# Patient Record
Sex: Female | Born: 1935 | Race: White | Hispanic: No | Marital: Married | Smoking: Former smoker
Health system: Southern US, Community
[De-identification: ages and names within clinical notes are randomized; demographics above are authoritative.]

## PROBLEM LIST (undated history)

## (undated) DIAGNOSIS — I10 Essential (primary) hypertension: Secondary | ICD-10-CM

## (undated) DIAGNOSIS — R413 Other amnesia: Secondary | ICD-10-CM

## (undated) DIAGNOSIS — F419 Anxiety disorder, unspecified: Secondary | ICD-10-CM

## (undated) DIAGNOSIS — E079 Disorder of thyroid, unspecified: Secondary | ICD-10-CM

## (undated) HISTORY — PX: ABDOMINAL HYSTERECTOMY: SHX81

## (undated) HISTORY — DX: Other amnesia: R41.3

## (undated) HISTORY — DX: Anxiety disorder, unspecified: F41.9

## (undated) HISTORY — PX: HIP FUSION: SHX669

## (undated) HISTORY — DX: Essential (primary) hypertension: I10

## (undated) HISTORY — DX: Disorder of thyroid, unspecified: E07.9

## (undated) HISTORY — PX: TUBAL LIGATION: SHX77

---

## 1997-10-20 ENCOUNTER — Observation Stay (HOSPITAL_COMMUNITY): Admission: RE | Admit: 1997-10-20 | Discharge: 1997-10-21 | Payer: Self-pay | Admitting: Urology

## 2001-05-13 ENCOUNTER — Encounter: Payer: Self-pay | Admitting: Emergency Medicine

## 2001-05-13 ENCOUNTER — Emergency Department (HOSPITAL_COMMUNITY): Admission: EM | Admit: 2001-05-13 | Discharge: 2001-05-14 | Payer: Self-pay | Admitting: Emergency Medicine

## 2001-10-19 ENCOUNTER — Ambulatory Visit (HOSPITAL_COMMUNITY): Admission: RE | Admit: 2001-10-19 | Discharge: 2001-10-19 | Payer: Self-pay | Admitting: Gastroenterology

## 2001-11-21 ENCOUNTER — Encounter: Payer: Self-pay | Admitting: Gastroenterology

## 2001-11-21 ENCOUNTER — Encounter: Admission: RE | Admit: 2001-11-21 | Discharge: 2001-11-21 | Payer: Self-pay | Admitting: Gastroenterology

## 2002-07-23 ENCOUNTER — Encounter: Admission: RE | Admit: 2002-07-23 | Discharge: 2002-07-23 | Payer: Self-pay | Admitting: Family Medicine

## 2002-07-23 ENCOUNTER — Encounter: Payer: Self-pay | Admitting: Family Medicine

## 2002-10-21 ENCOUNTER — Encounter: Admission: RE | Admit: 2002-10-21 | Discharge: 2002-10-21 | Payer: Self-pay | Admitting: Gastroenterology

## 2002-10-21 ENCOUNTER — Encounter: Payer: Self-pay | Admitting: Gastroenterology

## 2002-11-13 ENCOUNTER — Encounter: Payer: Self-pay | Admitting: Gastroenterology

## 2002-11-13 ENCOUNTER — Encounter: Admission: RE | Admit: 2002-11-13 | Discharge: 2002-11-13 | Payer: Self-pay | Admitting: Gastroenterology

## 2002-11-18 ENCOUNTER — Emergency Department (HOSPITAL_COMMUNITY): Admission: EM | Admit: 2002-11-18 | Discharge: 2002-11-18 | Payer: Self-pay | Admitting: Emergency Medicine

## 2002-11-18 ENCOUNTER — Encounter: Payer: Self-pay | Admitting: Emergency Medicine

## 2002-12-04 ENCOUNTER — Encounter (INDEPENDENT_AMBULATORY_CARE_PROVIDER_SITE_OTHER): Payer: Self-pay | Admitting: Specialist

## 2002-12-04 ENCOUNTER — Ambulatory Visit (HOSPITAL_COMMUNITY): Admission: RE | Admit: 2002-12-04 | Discharge: 2002-12-04 | Payer: Self-pay | Admitting: Gastroenterology

## 2003-11-10 ENCOUNTER — Observation Stay (HOSPITAL_COMMUNITY): Admission: RE | Admit: 2003-11-10 | Discharge: 2003-11-11 | Payer: Self-pay | Admitting: Urology

## 2005-03-27 ENCOUNTER — Emergency Department (HOSPITAL_COMMUNITY): Admission: EM | Admit: 2005-03-27 | Discharge: 2005-03-27 | Payer: Self-pay | Admitting: Emergency Medicine

## 2005-12-09 ENCOUNTER — Encounter (INDEPENDENT_AMBULATORY_CARE_PROVIDER_SITE_OTHER): Payer: Self-pay | Admitting: Specialist

## 2005-12-09 ENCOUNTER — Ambulatory Visit (HOSPITAL_COMMUNITY): Admission: RE | Admit: 2005-12-09 | Discharge: 2005-12-09 | Payer: Self-pay | Admitting: Gastroenterology

## 2005-12-13 ENCOUNTER — Ambulatory Visit (HOSPITAL_COMMUNITY): Admission: RE | Admit: 2005-12-13 | Discharge: 2005-12-13 | Payer: Self-pay | Admitting: Gastroenterology

## 2005-12-20 ENCOUNTER — Encounter: Admission: RE | Admit: 2005-12-20 | Discharge: 2005-12-20 | Payer: Self-pay | Admitting: Gastroenterology

## 2006-10-01 ENCOUNTER — Emergency Department (HOSPITAL_COMMUNITY): Admission: EM | Admit: 2006-10-01 | Discharge: 2006-10-01 | Payer: Self-pay | Admitting: Emergency Medicine

## 2008-05-25 ENCOUNTER — Emergency Department (HOSPITAL_COMMUNITY): Admission: EM | Admit: 2008-05-25 | Discharge: 2008-05-25 | Payer: Self-pay | Admitting: Emergency Medicine

## 2008-05-27 ENCOUNTER — Ambulatory Visit (HOSPITAL_COMMUNITY): Admission: RE | Admit: 2008-05-27 | Discharge: 2008-05-27 | Payer: Self-pay | Admitting: Emergency Medicine

## 2009-05-07 ENCOUNTER — Encounter: Admission: RE | Admit: 2009-05-07 | Discharge: 2009-05-07 | Payer: Self-pay | Admitting: Neurology

## 2009-06-09 ENCOUNTER — Ambulatory Visit (HOSPITAL_COMMUNITY): Admission: RE | Admit: 2009-06-09 | Discharge: 2009-06-09 | Payer: Self-pay | Admitting: Neurosurgery

## 2009-06-16 ENCOUNTER — Encounter: Admission: RE | Admit: 2009-06-16 | Discharge: 2009-06-16 | Payer: Self-pay | Admitting: Neurosurgery

## 2009-08-03 ENCOUNTER — Inpatient Hospital Stay (HOSPITAL_COMMUNITY): Admission: RE | Admit: 2009-08-03 | Discharge: 2009-08-07 | Payer: Self-pay | Admitting: Orthopedic Surgery

## 2010-06-22 LAB — URINALYSIS, ROUTINE W REFLEX MICROSCOPIC
Ketones, ur: NEGATIVE mg/dL
Nitrite: NEGATIVE
Nitrite: NEGATIVE
Specific Gravity, Urine: 1.01 (ref 1.005–1.030)
Urobilinogen, UA: 0.2 mg/dL (ref 0.0–1.0)
pH: 6 (ref 5.0–8.0)
pH: 5.5 (ref 5.0–8.0)

## 2010-06-22 LAB — CBC
Hemoglobin: 11.4 g/dL — ABNORMAL LOW (ref 12.0–15.0)
Hemoglobin: 9 g/dL — ABNORMAL LOW (ref 12.0–15.0)
Hemoglobin: 9.8 g/dL — ABNORMAL LOW (ref 12.0–15.0)
Hemoglobin: 11.7 g/dL — ABNORMAL LOW (ref 12.0–15.0)
MCHC: 33.6 g/dL (ref 30.0–36.0)
Platelets: 262 10*3/uL (ref 150–400)
RBC: 3.53 MIL/uL — ABNORMAL LOW (ref 3.87–5.11)
RDW: 13.8 % (ref 11.5–15.5)
RDW: 19.1 % — ABNORMAL HIGH (ref 11.5–15.5)
RDW: 13.9 % (ref 11.5–15.5)
WBC: 7.8 10*3/uL (ref 4.0–10.5)
WBC: 8.3 10*3/uL (ref 4.0–10.5)

## 2010-06-22 LAB — BASIC METABOLIC PANEL
BUN: 11 mg/dL (ref 6–23)
CO2: 30 mEq/L (ref 19–32)
Calcium: 8.5 mg/dL (ref 8.4–10.5)
Calcium: 8.8 mg/dL (ref 8.4–10.5)
Calcium: 8.8 mg/dL (ref 8.4–10.5)
GFR calc Af Amer: 53 mL/min — ABNORMAL LOW (ref >60)
GFR calc Af Amer: >60 mL/min (ref >60)
GFR calc non Af Amer: 44 mL/min — ABNORMAL LOW (ref >60)
GFR calc non Af Amer: 59 mL/min — ABNORMAL LOW (ref >60)
Glucose, Bld: 120 mg/dL — ABNORMAL HIGH (ref 70–99)
Glucose, Bld: 140 mg/dL — ABNORMAL HIGH (ref 70–99)
Glucose, Bld: 184 mg/dL — ABNORMAL HIGH (ref 70–99)
Potassium: 3.8 mEq/L (ref 3.5–5.1)
Sodium: 134 mEq/L — ABNORMAL LOW (ref 135–145)
Sodium: 139 mEq/L (ref 135–145)

## 2010-06-22 LAB — URINE CULTURE: Culture: NO GROWTH

## 2010-06-22 LAB — TYPE AND SCREEN: Antibody Screen: NEGATIVE

## 2010-06-22 LAB — COMPREHENSIVE METABOLIC PANEL
ALT: 13 U/L (ref 0–35)
Albumin: 4.3 g/dL (ref 3.5–5.2)
Alkaline Phosphatase: 47 U/L (ref 39–117)
Potassium: 4.4 mEq/L (ref 3.5–5.1)
Sodium: 141 mEq/L (ref 135–145)
Total Protein: 7.2 g/dL (ref 6.0–8.3)

## 2010-06-22 LAB — PROTIME-INR
INR: 1 (ref 0.00–1.49)
INR: 1.12 (ref 0.00–1.49)
INR: 1.2 (ref 0.00–1.49)
Prothrombin Time: 14.3 seconds (ref 11.6–15.2)
Prothrombin Time: 14.6 seconds (ref 11.6–15.2)

## 2010-06-22 LAB — ABO/RH: ABO/RH(D): A POS

## 2010-08-20 NOTE — Op Note (Signed)
NAME:  Elaine Mcdonald, Elaine Mcdonald                      ACCOUNT NO.:  [id number]   MEDICAL RECORD NO.:  [id number]                   PATIENT TYPE:  AMB   LOCATION:  ENDO                                 FACILITY:  [facility]   PHYSICIAN:  Anselmo Rod, M.D.               DATE OF BIRTH:  1930-12-01   DATE OF PROCEDURE:  12/05/2002  DATE OF DISCHARGE:  12/04/2002                                 OPERATIVE REPORT   PROCEDURE. PERFORMED:  Colonoscopy with biopsies.   ENDOSCOPIST:  Charna Elizabeth, M.D.   INSTRUMENT USED:  Olympus video colonoscope.   INDICATIONS FOR PROCEDURE:  The patient is a 77 year old white female with a  history of change in bowel habits, severe diarrhea and abdominal pain.  Rule  out colonic polyps, masses, etc.  The patient is known to have  diverticulosis from a limited exam done by Dr. Tasia Catchings in the recent  past.  History of intermittent rectal bleeding with change in bowel habits.   PREPROCEDURE PREPARATION:  Informed consent was procured from the patient.  The patient was advised to discontinue her aspirin and Plavix prior to the  procedure and was prepped with a bottle of magnesium citrate and a gallon of  GoLYTELY the night prior to the procedure.   PREPROCEDURE PHYSICAL:  The patient had stable vital signs.  Neck supple.  Chest clear to auscultation.  S1 and S2 regular.  Abdomen soft with normal  bowel sounds.   DESCRIPTION OF PROCEDURE:  The patient was placed in left lateral decubitus  position and sedated with an additional 20 mg of Demerol and 2 mg of Versed  intravenously.  Once the patient was adequately sedated and maintained on  low flow oxygen and continuous cardiac monitoring, the Olympus video  colonoscope was advanced from the rectum to the cecum with difficulty.  The  patient had a very tortuous atonic colon.  The patient's position was  changed from the left lateral to the supine and right lateral position.  With gentle application of  abdominal pressure to reach the cecum, there was  a large amount of residual stool in the cecum and therefore the cecal base  was not identified but the ileocecal valve appeared healthy.  There were a  few scattered diverticula in the left colon.  There was a patchy area of  erythema biopsied from 10 to 20 cm to rule out colitis.  Internal  hemorrhoids were seen on retroflexion in the rectum which explained the  patient's rectal bleeding.  No masses or polyps were present.  The patient  tolerated the procedure well without complications.   IMPRESSION:  1. Very tortuous atonic colon (technically difficult procedure).  2. Left-sided diverticulosis.  3. Patchy erythema from 10 to 20 cm biopsied for pathology to rule out     colitis.  4. Internal hemorrhoids.   RECOMMENDATIONS:  1. Await pathology results.  2. High fiber diet with liberal  fluid intake.  3. Outpatient follow-up in the next two weeks for further recommendations.                                                   Anselmo Rod, M.D.    JNM/MEDQ  D:  12/05/2002  T:  12/06/2002  Job:  [id number]   cc:   Teena Irani. Arlyce Dice, M.D.  [address]  [place]  [place] [address]  Fax: [phone]

## 2010-08-20 NOTE — Op Note (Signed)
NAME:  Elaine Mcdonald, Elaine Mcdonald                      ACCOUNT NO.:  [id number]   MEDICAL RECORD NO.:  [id number]                   PATIENT TYPE:  OBV   LOCATION:  0381                                 FACILITY:  [facility]   PHYSICIAN:  Valetta Fuller, M.D.               DATE OF BIRTH:  06-Sep-1930   DATE OF PROCEDURE:  11/10/2003  DATE OF DISCHARGE:                                 OPERATIVE REPORT   PREOPERATIVE DIAGNOSES:  Stress urinary incontinence.   POSTOPERATIVE DIAGNOSES:  Stress urinary incontinence.   PROCEDURE:  Transobturator urethral sling.   SURGEON:  Valetta Fuller, M.D.   RESIDENT SURGEON:  Thyra Breed, MD   ANESTHESIA:  General endotracheal.   ESTIMATED BLOOD LOSS:  Less than 15 mL.   DRAINS:  5 French Foley catheter to straight drain.   COMPLICATIONS:  None.   INDICATIONS FOR PROCEDURE:  Ms. Hollingshead is a pleasant 77 year old female  with a complicated urologic history.  She has had problems in the past with  both stress and urge urinary incontinence especially at night. She had a  bladder tack and hysterectomy performed in the early 1980's.  Approximately  4-5 years ago, she also had a sling procedure performed with bone anchors by  Dr. Patsi Sears. She initially had marked improvement but has now developed  recurrent leakage most consistent with a stress component.  On physical  examination, she showed evidence of vaginal atrophy as well as moderate to  severe stress leakage.  At this time, she is willing to undergo with third  procedure to try and remedy her stress urinary incontinence.  We have  discussed with her the possibilities including a SPARC versus a  transobturator sling which can be decided in the operating room once her  anatomy is better defined. The patient understands all the risks, benefits,  and alternatives of this procedure and is willing to proceed. Informed  consent has been obtained.   DESCRIPTION OF PROCEDURE:  Following identification by  her arm bracelet, the  patient was brought to the operating room and placed in the supine position.  Her she underwent successful induction of general endotracheal anesthesia  and received preoperative IV antibiotics. She was then moved to the dorsal  lithotomy position.  Her perineum and genitalia were then prepped with  Betadine and she was draped in the usual sterile fashion.  We placed a  weighted vaginal speculum within the vaginal vault. There did appear to be  vaginal atrophy present consistent with a hypoestrogenic state.  A 16 French  Foley catheter was then placed to straight drain and the bladder drained in  its entirety. A catheter plug was then used to cap the Foley catheter.  An  Allis clamp was then used to grasp the distal aspect of the urethra and used  to elevate the anterior vaginal mucosa.  There was definitely evidence of  previous procedures in this area as there was  some fibrotic tissue as well  as scarring in the anterior vaginal vault.  A grade 1-2 cystocele was also  noted but was largely posterior to the area of the proposed sling procedure.  Therefore we elected not to perform any modification to relieve her  cystocele.  Marcaine 0.25% with epinephrine was then used to infiltrate the  anterior vaginal mucosa in preparation for the incision.  A #11 blade  scalpel on a long handle knife was then used to create an approximate 1 cm  vertical incision at mid urethra on the anterior vaginal mucosa.  Using  [employer] and pickups with teeth, we then carefully dissected  laterally with care taken to avoid the urethra to create an area for passage  of the transobturator needle. This tissue was quite fibrotic and required  more dissection than usual. However, with continued careful dissection, both  sides were developed lateral to the urethra just beneath the pubis to  facilitate the surgeon's finger.  At this time, we identified the area for  our transobturator  incisions.  Approximately 5 cm lateral to the clitoris  bilaterally we marked our areas to make our incisions. Using a 15 blade  scalpel, two stab incisions were then created. The C shaped transobturator  needle was then utilized first on the patient's left to puncture the  obturator membrane and then pass close to the pubic bone and finally guided  by the surgeon's finger through the previously created anterior vaginal  incision lateral and well away from the urethra. Care was taken not to  button hole the vaginal mucosa. This was visually inspected to ensure proper  passage of the needle into the vagina.  We then placed one end of the  transobturator tape through the needle. This was pulled back through the  stab incision previously created.  An identical procedure was then performed  on the patient's right.  The tape was then pulled until there was adequate  tension beneath the mid urethra.  A right angled clamp was then able to pass  between the urethra and the sling tape to ensure proper tensioning.  In  fact, we applied slightly more tension to the tape given this as her third  incontinence procedure with abundant fibrotic tissue underlying the urethra.  Once satisfied with the placement of our tape, excess tape was then trimmed  exiting the stab incisions.  A one layer closure was then performed in the  anterior vaginal mucosa using a running 2-0 Vicryl suture.  Hemostasis was  excellent at this point and there was no evidence of active bleeding.  Vaginal packing soaked in estrogen cream was then inserted into the vagina.  The catheter was hooked to straight drainage. The stab incisions were  cleaned, dried and Dermabond applied.  This marked termination of the  procedure.  The patient tolerated the procedure well and there were no  complications.  Please note that Valetta Fuller, M.D. was present and participated in the entire procedure as he was the responsible surgeon.  All   sponge, needle and instrument counts were correct x2.   DISPOSITION:  After awaking from general anesthesia, the patient was  transported to the post anesthesia care unit in stable condition.  From  here, she would be admitted for 23 hour observation and removal of her Foley  catheter and vaginal packing in the morning.     Thyra Breed, MD  Valetta Fuller, M.D.    EG/MEDQ  D:  11/10/2003  T:  11/11/2003  Job:  [id number]

## 2010-08-20 NOTE — Op Note (Signed)
NAME:  Elaine Mcdonald, Elaine Mcdonald                      ACCOUNT NO.:  [id number]   MEDICAL RECORD NO.:  [id number]                   PATIENT TYPE:  AMB   LOCATION:  ENDO                                 FACILITY:  [facility]   PHYSICIAN:  Anselmo Rod, M.D.               DATE OF BIRTH:  October 05, 1930   DATE OF PROCEDURE:  12/04/2002  DATE OF DISCHARGE:  12/04/2002                                 OPERATIVE REPORT   PROCEDURE PERFORMED:  Esophagogastroduodenoscopy with multiple biopsies.   ENDOSCOPIST:  Charna Elizabeth, M.D.   INSTRUMENT USED:  Olympus video panendoscope.   INDICATIONS FOR PROCEDURE:  The patient is a 77 year old white female with  history of abdominal pain in the epigastric and right upper quadrant area,  ongoing diarrhea.  Rule out peptic ulcer disease, esophagitis, gastritis,  etc. Small bowel biopsies are planned to work up the patient's diarrhea.   PREPROCEDURE PREPARATION:  Informed consent was procured from the patient.  The patient was fasted for eight hours prior to the procedure.   PREPROCEDURE PHYSICAL:  The patient had stable vital signs.  Neck supple,  chest clear to auscultation.  S1, S2 regular.  Abdomen soft with normal  bowel sounds.   DESCRIPTION OF PROCEDURE:  The patient was placed in the left lateral  decubitus position and sedated with 80 mg of Demerol and 8 mg of Versed  intravenously.  Once the patient was adequately sedated and maintained on  low-flow oxygen and continuous cardiac monitoring, the Olympus video  panendoscope was advanced through the mouth piece over the tongue into the  esophagus under direct vision.  There was evidence of distal esophagitis.  Biopsies were done to confirm the diagnosis and rule out Barrett's mucosa.  The scope was then advanced to the stomach.  Antral erosions were noted.  These were biopsied to rule out Helicobacter pylori.  The small bowel mucosa  seemed erythematous and therefore small bowel biopsies were done to rule  out  sprue.  Retroflexion in the high cardia revealed no abnormalities.  The  patient tolerated the procedure well without complications.  The proximal  esophagus and the proximal stomach appeared normal   IMPRESSION:  1. Moderate distal esophagitis.  Biopsies done to rule out Barrett's.  2. Antral erosions.  Biopsies done to rule out Helicobacter pylori.  3. Erythematous changes in small bowel.  Biopsies done to rule out sprue.    RECOMMENDATIONS:  1. Await pathology results.  2. Proceed with a colonoscopy at this time.  Further recommendations to be     made after colonoscopy has been done.                                                Anselmo Rod, M.D.    JNM/MEDQ  D:  12/05/2002  T:  12/06/2002  Job:  [id number]   cc:   Teena Irani. Arlyce Dice, M.D.  [address]  [place]  [place] [address]  Fax: [phone]

## 2010-08-20 NOTE — Op Note (Signed)
NAMERONIA, HAZELETT            ACCOUNT NO.:  [id number]   MEDICAL RECORD NO.:  [id number]          PATIENT TYPE:  AMB   LOCATION:  ENDO                         FACILITY:  [facility]   PHYSICIAN:  Anselmo Rod, M.D.  DATE OF BIRTH:  1930/10/04   DATE OF PROCEDURE:  12/09/2005  DATE OF DISCHARGE:  12/09/2005                                 OPERATIVE REPORT   PROCEDURE PERFORMED:  Esophagogastroduodenoscopy with multiple cold  biopsies.   ENDOSCOPIST:  Anselmo Rod, M.D.   INSTRUMENT USED:  Olympus videoscopic pan endoscope.   INDICATIONS FOR PROCEDURE:  This 77 year old white female with a history of  epigastric and right upper quadrant pain, reflux and occasional dysphagia,  rule out esophagitis, peptic ulcer disease, etc.  Patient had a recent  abdominal ultrasound that was unrevealing.   PRE-PROCEDURE PREPARATION:  Informed consent was procured from the patient.  The patient was fasted for four hours prior to the procedure.  Risks and  benefits of the procedure were discussed in great detail.   PRE-PROCEDURE PHYSICAL EXAMINATION:  VITAL SIGNS:  Stable vital signs.  NECK:  Supple.  CHEST:  Clear to auscultation.  HEART:  S1 and S2.  Regular.  ABDOMEN:  Soft with normal bowel sounds.   DESCRIPTION OF PROCEDURE:  The patient was placed in the left lateral  decubitus position and sedated with 100 mcg of fentanyl and 7.5 mg of Versed  in slow, incremental doses.  Once the patient was adequately sedated and  maintained on low flow oxygen with continuous cardiac monitoring, the  Olympus videoscopic pan endoscope was advanced with the mouthpiece over the  tongue into the esophagus.  Under direct vision, a linear ulceration was  noted in the esophagus from 20-30 cm with the prominent margins.  This area  was extensively biopsied to rule out malignancy.  A small patch of mucosa  was biopsied above the Z line to rule out Barrett's.  The rest of the  gastric mucosa and the proximal  small bowel appeared normal.  The patient  tolerated the procedure well without immediate complications.   IMPRESSION:  1. A linear ulcer along the entire length of the mid esophagus from 20-30      cm with prominent margins biopsied to rule out malignancy.  2. A small patch of ?Barrett'S like mucosa was biopsied just at the Z line      biopsied to rule out Barrett's.   RECOMMENDATIONS:  1. Await pathology results.  2. Proceed with a HIDA scan (with CCK injection).  A low fat diet has been      recommended for now.  3. Outpatient followup after the above-mentioned test results have been      procured.      Anselmo Rod, M.D.  Electronically Signed     JNM/MEDQ  D:  12/11/2005  T:  12/11/2005  Job:  [id number]   cc:   Gloriajean Dell. Andrey Campanile, M.D.

## 2010-11-10 ENCOUNTER — Encounter (HOSPITAL_BASED_OUTPATIENT_CLINIC_OR_DEPARTMENT_OTHER): Payer: Medicare Other | Admitting: Hematology and Oncology

## 2010-11-10 ENCOUNTER — Other Ambulatory Visit: Payer: Self-pay | Admitting: Hematology and Oncology

## 2010-11-10 DIAGNOSIS — D539 Nutritional anemia, unspecified: Secondary | ICD-10-CM

## 2010-11-10 LAB — URINALYSIS, MICROSCOPIC - CHCC
Bilirubin (Urine): NEGATIVE
Glucose: NEGATIVE g/dL
Ketones: NEGATIVE mg/dL
RBC count: NEGATIVE (ref 0–2)
pH: 6 (ref 4.6–8.0)

## 2010-11-10 LAB — CBC WITH DIFFERENTIAL/PLATELET
Basophils Absolute: 0 10*3/uL (ref 0.0–0.1)
EOS%: 2.7 % (ref 0.0–7.0)
Eosinophils Absolute: 0.1 10*3/uL (ref 0.0–0.5)
HGB: 11.5 g/dL — ABNORMAL LOW (ref 11.6–15.9)
MCH: 34.7 pg — ABNORMAL HIGH (ref 25.1–34.0)
NEUT#: 3.3 10*3/uL (ref 1.5–6.5)
RDW: 14 % (ref 11.2–14.5)
lymph#: 1.3 10*3/uL (ref 0.9–3.3)

## 2010-11-10 LAB — MORPHOLOGY

## 2010-11-12 LAB — PROTEIN ELECTROPHORESIS, SERUM, WITH REFLEX
Albumin ELP: 61.8 % (ref 55.8–66.1)
Alpha-1-Globulin: 4.8 % (ref 2.9–4.9)
Alpha-2-Globulin: 11.1 % (ref 7.1–11.8)
Beta 2: 5.2 % (ref 3.2–6.5)
Beta Globulin: 6.5 % (ref 4.7–7.2)

## 2010-11-12 LAB — DIRECT ANTIGLOBULIN TEST (NOT AT ARMC): DAT (Complement): NEGATIVE

## 2010-11-12 LAB — COMPREHENSIVE METABOLIC PANEL
Alkaline Phosphatase: 52 U/L (ref 39–117)
BUN: 32 mg/dL — ABNORMAL HIGH (ref 6–23)
CO2: 22 mEq/L (ref 19–32)
Creatinine, Ser: 1.23 mg/dL — ABNORMAL HIGH (ref 0.50–1.10)
Glucose, Bld: 100 mg/dL — ABNORMAL HIGH (ref 70–99)
Sodium: 140 mEq/L (ref 135–145)
Total Bilirubin: 0.4 mg/dL (ref 0.3–1.2)
Total Protein: 6.8 g/dL (ref 6.0–8.3)

## 2010-11-12 LAB — IRON AND TIBC
%SAT: 35 % (ref 20–55)
Iron: 124 ug/dL (ref 42–145)
TIBC: 351 ug/dL (ref 250–470)
UIBC: 227 ug/dL

## 2010-11-12 LAB — LACTATE DEHYDROGENASE: LDH: 129 U/L (ref 94–250)

## 2010-11-12 LAB — FERRITIN: Ferritin: 219 ng/mL (ref 10–291)

## 2010-11-17 ENCOUNTER — Encounter (HOSPITAL_BASED_OUTPATIENT_CLINIC_OR_DEPARTMENT_OTHER): Payer: Medicare Other | Admitting: Hematology and Oncology

## 2010-11-17 DIAGNOSIS — D539 Nutritional anemia, unspecified: Secondary | ICD-10-CM

## 2011-01-27 ENCOUNTER — Other Ambulatory Visit: Payer: Self-pay | Admitting: Gastroenterology

## 2011-01-27 DIAGNOSIS — K625 Hemorrhage of anus and rectum: Secondary | ICD-10-CM

## 2011-01-27 DIAGNOSIS — Z800 Family history of malignant neoplasm of digestive organs: Secondary | ICD-10-CM

## 2012-05-31 DIAGNOSIS — R490 Dysphonia: Secondary | ICD-10-CM | POA: Insufficient documentation

## 2012-09-28 ENCOUNTER — Other Ambulatory Visit (HOSPITAL_COMMUNITY): Payer: Self-pay | Admitting: Orthopedic Surgery

## 2012-09-28 DIAGNOSIS — M8580 Other specified disorders of bone density and structure, unspecified site: Secondary | ICD-10-CM

## 2012-10-04 ENCOUNTER — Ambulatory Visit (HOSPITAL_COMMUNITY)
Admission: RE | Admit: 2012-10-04 | Discharge: 2012-10-04 | Disposition: A | Discharge status: Home / Self Care | Payer: Medicare Other | Source: Ambulatory Visit | Attending: Orthopedic Surgery | Admitting: Orthopedic Surgery

## 2012-10-04 DIAGNOSIS — Z780 Asymptomatic menopausal state: Secondary | ICD-10-CM | POA: Insufficient documentation

## 2012-10-04 DIAGNOSIS — M8580 Other specified disorders of bone density and structure, unspecified site: Secondary | ICD-10-CM

## 2012-10-04 DIAGNOSIS — Z13820 Encounter for screening for osteoporosis: Secondary | ICD-10-CM | POA: Insufficient documentation

## 2013-03-25 ENCOUNTER — Other Ambulatory Visit: Payer: Self-pay | Admitting: Urology

## 2013-03-25 DIAGNOSIS — N3020 Other chronic cystitis without hematuria: Secondary | ICD-10-CM

## 2013-04-09 ENCOUNTER — Ambulatory Visit
Admission: RE | Admit: 2013-04-09 | Discharge: 2013-04-09 | Disposition: A | Discharge status: Home / Self Care | Payer: Medicare Other | Source: Ambulatory Visit | Attending: Urology | Admitting: Urology

## 2013-04-09 DIAGNOSIS — N3020 Other chronic cystitis without hematuria: Secondary | ICD-10-CM

## 2013-10-25 ENCOUNTER — Encounter (INDEPENDENT_AMBULATORY_CARE_PROVIDER_SITE_OTHER): Payer: Self-pay | Admitting: General Surgery

## 2013-10-25 ENCOUNTER — Ambulatory Visit (INDEPENDENT_AMBULATORY_CARE_PROVIDER_SITE_OTHER): Payer: Medicare Other | Admitting: General Surgery

## 2013-10-25 VITALS — BP 118/78 | HR 64 | Temp 97.0°F | Ht 64.0 in | Wt 183.0 lb

## 2013-10-25 DIAGNOSIS — L290 Pruritus ani: Secondary | ICD-10-CM

## 2013-10-25 DIAGNOSIS — K645 Perianal venous thrombosis: Secondary | ICD-10-CM

## 2013-10-25 MED ORDER — FLUCONAZOLE 100 MG PO TABS: 100.0000 mg | ORAL_TABLET | Freq: Every day | ORAL | Status: AC

## 2013-10-25 NOTE — Progress Notes (Signed)
Patient ID: Elaine RosenthalSally C Mcdonald, female   DOB: [birth year], 80 y.o.   MRN: [id number]  History: This patient was referred to the urgent office today by Dr. Anselmo RodJyothi Nat Mann for painful hemorrhoids and irritation of the perianal skin. The patient states that she has had hemorrhoids for over 50 years but has never required surgical intervention. For the past 3 months she's been having increasing swelling and symptoms. The past 2 weeks she's been having a lot of pain and burning and a little bit of bleeding. She does not have any colorectal problems otherwise. Comorbidities include hypothyroidism, hypertension, hyperlipidemia, anxiety and depression on Lexapro  Past history, family history, social history, review of systems are documented on the chart and otherwise noncontributory  Exam: Pleasant. Alert. Quite anxious but in no physical distress. Abdomen soft and nontender Rectal exam reveals circumferential erythema and  pruritus and but no fissuring. She has internal and external hemorrhoids. There is a thrombosed hemorrhoid in the right posterior position. I anesthetized the thrombosed hemorrhoid in the right posterior position excised this conservatively. She tolerated this very well  Assessment: Pruritus ani, moderately severe but without fissures or ulcers External thrombosed hemorrhoid, right lateral Internal hemorrhoids  Plan: Thrombosed external hemorrhoid excised  sitz bath 3 times a day Discontinue all soap, creams and ointments that she has been using Dry gauze only Metamucil twice a day Diflucan for one week in case there's a fungall component Restrict activities for the next couple days because of swelling Return if she does not improve in 3 weeks.   Angelia MouldHaywood M. Derrell LollingIngram, M.D., [facility] General and Minimally invasive Surgery Breast and Colorectal Surgery Office:   [phone] Pager:   [phone]

## 2013-10-25 NOTE — Patient Instructions (Signed)
The first problem that I saw was a thrombosed hemorrhoid on the right posterior side. This was anesthetized and excised.  The second problem muscle was inflammation and dermatitis. This is a condition we called pruritus ani.  You will be given a booklet on how to help the skin heel  I will call in a prescription for Diflucan  in case there is a fungal component to this... which is common  Not use any creams or soap at any time.  Do not scrub the anal area with anything  Take warm tub baths 3 times a day and  simply place dry gauze over the hemorrhoids  Take Metamucil twice a day to avoid constipation  Drink lots of water  Do not scratch the perianal skin that would produce more damage  This may take  3 weeks to clear up.  Return to see us if further problems arise.

## 2013-12-13 ENCOUNTER — Ambulatory Visit (INDEPENDENT_AMBULATORY_CARE_PROVIDER_SITE_OTHER): Payer: Medicare Other | Admitting: General Surgery

## 2014-04-18 ENCOUNTER — Encounter (INDEPENDENT_AMBULATORY_CARE_PROVIDER_SITE_OTHER): Payer: Self-pay

## 2014-04-18 ENCOUNTER — Ambulatory Visit (INDEPENDENT_AMBULATORY_CARE_PROVIDER_SITE_OTHER): Payer: Medicare Other | Admitting: Family Medicine

## 2014-04-18 ENCOUNTER — Encounter: Payer: Self-pay | Admitting: Family Medicine

## 2014-04-18 VITALS — BP 122/73 | HR 73 | Ht 65.0 in | Wt 187.0 lb

## 2014-04-18 DIAGNOSIS — M170 Bilateral primary osteoarthritis of knee: Secondary | ICD-10-CM

## 2014-04-18 DIAGNOSIS — M25562 Pain in left knee: Secondary | ICD-10-CM

## 2014-04-18 DIAGNOSIS — M1710 Unilateral primary osteoarthritis, unspecified knee: Secondary | ICD-10-CM | POA: Insufficient documentation

## 2014-04-18 DIAGNOSIS — M25559 Pain in unspecified hip: Secondary | ICD-10-CM

## 2014-04-18 DIAGNOSIS — M25551 Pain in right hip: Secondary | ICD-10-CM

## 2014-04-18 DIAGNOSIS — M25561 Pain in right knee: Secondary | ICD-10-CM

## 2014-04-18 DIAGNOSIS — Z96649 Presence of unspecified artificial hip joint: Secondary | ICD-10-CM

## 2014-04-18 DIAGNOSIS — G8929 Other chronic pain: Secondary | ICD-10-CM

## 2014-04-18 DIAGNOSIS — M1711 Unilateral primary osteoarthritis, right knee: Secondary | ICD-10-CM

## 2014-04-18 DIAGNOSIS — Z9660 Presence of unspecified orthopedic joint implant: Secondary | ICD-10-CM

## 2014-04-18 DIAGNOSIS — M179 Osteoarthritis of knee, unspecified: Secondary | ICD-10-CM | POA: Insufficient documentation

## 2014-04-18 MED ORDER — METHYLPREDNISOLONE ACETATE 40 MG/ML IJ SUSP
40.0000 mg | Freq: Once | INTRAMUSCULAR | Status: AC
  Administered 2014-04-18: 40 mg via INTRA_ARTICULAR

## 2014-04-18 NOTE — Progress Notes (Signed)
Patient ID: Elaine RosenthalSally C Provencio, female   DOB: Mar 25, 1931, 81 y.o.   MRN: [id number]  Elaine RosenthalSally C Mccosh - 81 y.o. female MRN [id number]  Date of birth: Mar 25, 1931    SUBJECTIVE:     Right hip pain and bilateral right greater than left knee pain. Has been followed by orthopedics in town. They did her hip replacement several years ago. Since then she's had chronic right hip pain that particularly bothers her with any standing or walking. Is mostly in the lateral portion of her hip and radiates down the lateral part of her leg to about the level of the knee. Does not radiate to the foot. #2. She's had chronic bilateral knee pain. Has previously had echo steroid injections in both knees last injection sometime around June 2015. She's also undergone Supartz series previously. She brings her x-rays from her orthopedist's office with her. She would like evaluation for continued management of knee and hip pain. ROS:     Hip and knee pain as above. No unusual weight loss or weight gain. No fever, sweats, chills. No falls.  PERTINENT  PMH / PSH FH / / SH:  Past Medical, Surgical, Social, and Family History Reviewed & Updated in the EMR.  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 GEN.: Well-developed female no acute distress HIP: Right. Tender to palpation along the greater trochanteric bursa area and this reproduces some of her pain. Hip flexion is also painful between range of of 90 and 110, that is from a seated position. KNEES: Bilaterally she has external changes consistent with osteoarthritis. Right knee is tender to palpation along the medial joint line and very slightly tender to palpation at the lateral joint  line. Left knee has mild tenderness at the medial joint line 9 at the lateral joint line. There is no effusion. No erythema. Popliteal space is benign. She has full range of motion flexion extension. She is ligamentously intact bilaterally bilaterally her calves are soft. Distally she is intact sensation to soft touch.  INJECTION: Patient was given informed consent, signed copy in the chart. Appropriate time out was taken. Area prepped and draped in usual sterile fashion. 1 cc of methylprednisolone 40 mg/ml plus  4 cc of 1% lidocaine without epinephrine was injected into the right knee using a(n) anterior medial approach. The patient tolerated the procedure well. There were no complications. Post procedure instructions were given.   ASSESSMENT & PLAN:  See problem based charting & AVS for pt instructions.

## 2014-04-18 NOTE — Assessment & Plan Note (Signed)
She opted for corticosteroid injection in the right knee today she estimated the pain in the right knee was 6 out of 10 today in her left knee was bothering her 3-4 out of 10. She wants to compare how they do over the next 4 weeks. I plan to see her back in 4 weeks for further evaluation of her hip.

## 2014-04-18 NOTE — Assessment & Plan Note (Signed)
Her gait is fairly abnormal I think this is contributing to chronic greater trochanteric bursitis and hip pain. I think the first thing to do is see if we can get her mechanically more symmetrical and using the appropriate muscles. She agrees to set up for this cotherapy specifically gait evaluation and training and then I'll see her back in 45 weeks. Would consider greater trochanteric bursa injection in the future.

## 2014-05-01 ENCOUNTER — Ambulatory Visit: Payer: Medicare Other | Admitting: Physical Therapy

## 2014-05-08 ENCOUNTER — Ambulatory Visit: Payer: Medicare Other | Attending: Family Medicine | Admitting: Physical Therapy

## 2014-05-08 DIAGNOSIS — R262 Difficulty in walking, not elsewhere classified: Secondary | ICD-10-CM | POA: Diagnosis not present

## 2014-05-08 DIAGNOSIS — M25551 Pain in right hip: Secondary | ICD-10-CM | POA: Diagnosis not present

## 2014-05-08 DIAGNOSIS — M25561 Pain in right knee: Secondary | ICD-10-CM | POA: Insufficient documentation

## 2014-05-08 DIAGNOSIS — M25562 Pain in left knee: Secondary | ICD-10-CM | POA: Diagnosis not present

## 2014-05-08 NOTE — Patient Instructions (Signed)
Sleeping on Back  Place pillow under knees. A pillow with cervical support and a roll around waist are also helpful. Copyright  VHI. All rights reserved.  Sleeping on Side Place pillow between knees. Use cervical support under neck and a roll around waist as needed. Copyright  VHI. All rights reserved.   Sleeping on Stomach   If this is the only desirable sleeping position, place pillow under lower legs, and under stomach or chest as needed.  Posture - Sitting   Sit upright, head facing forward. Try using a roll to support lower back. Keep shoulders relaxed, and avoid rounded back. Keep hips level with knees. Avoid crossing legs for long periods. Stand to Sit / Sit to Stand   To sit: Bend knees to lower self onto front edge of chair, then scoot back on seat. To stand: Reverse sequence by placing one foot forward, and scoot to front of seat. Use rocking motion to stand up.   Work Height and Reach  Ideal work height is no more than 2 to 4 inches below elbow level when standing, and at elbow level when sitting. Reaching should be limited to arm's length, with elbows slightly bent.  Bending  Bend at hips and knees, not back. Keep feet shoulder-width apart.    Posture - Standing   Good posture is important. Avoid slouching and forward head thrust. Maintain curve in low back and align ears over shoul- ders, hips over ankles.  Alternating Positions   Alternate tasks and change positions frequently to reduce fatigue and muscle tension. Take rest breaks. Computer Work   Position work to face forward. Use proper work and seat height. Keep shoulders back and down, wrists straight, and elbows at right angles. Use chair that provides full back support. Add footrest and lumbar roll as needed.  Getting Into / Out of Car  Lower self onto seat, scoot back, then bring in one leg at a time. Reverse sequence to get out.  Dressing  Lie on back to pull socks or slacks over feet, or  sit and bend leg while keeping back straight.    Housework - Sink  Place one foot on ledge of cabinet under sink when standing at sink for prolonged periods.   Pushing / Pulling  Pushing is preferable to pulling. Keep back in proper alignment, and use leg muscles to do the work.  Deep Squat   Squat and lift with both arms held against upper trunk. Tighten stomach muscles without holding breath. Use smooth movements to avoid jerking.  Avoid Twisting   Avoid twisting or bending back. Pivot around using foot movements, and bend at knees if needed when reaching for articles.  Carrying Luggage   Distribute weight evenly on both sides. Use a cart whenever possible. Do not twist trunk. Move body as a unit.   Lifting Principles .Maintain proper posture and head alignment. .Slide object as close as possible before lifting. .Move obstacles out of the way. .Test before lifting; ask for help if too heavy. .Tighten stomach muscles without holding breath. .Use smooth movements; do not jerk. .Use legs to do the work, and pivot with feet. .Distribute the work load symmetrically and close to the center of trunk. .Push instead of pull whenever possible.   Ask For Help   Ask for help and delegate to others when possible. Coordinate your movements when lifting together, and maintain the low back curve.  Log Roll   Lying on back, bend left knee and place left   arm across chest. Roll all in one movement to the right. Reverse to roll to the left. Always move as one unit. Housework - Sweeping  Use long-handled equipment to avoid stooping.   Housework - Wiping  Position yourself as close as possible to reach work surface. Avoid straining your back.  Laundry - Unloading Wash   To unload small items at bottom of washer, lift leg opposite to arm being used to reach.  Gardening - Raking  Move close to area to be raked. Use arm movements to do the work. Keep back straight and avoid  twisting.     Cart  When reaching into cart with one arm, lift opposite leg to keep back straight.   Getting Into / Out of Bed  Lower self to lie down on one side by raising legs and lowering head at the same time. Use arms to assist moving without twisting. Bend both knees to roll onto back if desired. To sit up, start from lying on side, and use same move-ments in reverse. Housework - Vacuuming  Hold the vacuum with arm held at side. Step back and forth to move it, keeping head up. Avoid twisting.   Laundry - Loading Wash  Position laundry basket so that bending and twisting can be avoided.   Laundry - Unloading Dryer  Squat down to reach into clothes dryer or use a reacher.  Gardening - Weeding / Planting  Squat or Kneel. Knee pads may be helpful.                   Posture Tips DO: - stand tall and erect - keep chin tucked in - keep head and shoulders in alignment - check posture regularly in mirror or large window - pull head back against headrest in car seat;  Change your position often.  Sit with lumbar support. DON'T: - slouch or slump while watching TV or reading - sit, stand or lie in one position  for too long;  Sitting is especially hard on the spine so if you sit at a desk/use the computer, then stand up often!   Copyright  VHI. All rights reserved.  Posture - Standing   Good posture is important. Avoid slouching and forward head thrust. Maintain curve in low back and align ears over shoul- ders, hips over ankles.  Pull your belly button in toward your back bone.   Copyright  VHI. All rights reserved.  Posture - Sitting   Sit upright, head facing forward. Try using a roll to support lower back. Keep shoulders relaxed, and avoid rounded back. Keep hips level with knees. Avoid crossing legs for long periods.   Copyright  VHI. All rights reserved.   Posture Tips DO: - stand tall and erect - keep chin tucked in - keep head and shoulders in  alignment - check posture regularly in mirror or large window - pull head back against headrest in car seat;  Change your position often.  Sit with lumbar support. DON'T: - slouch or slump while watching TV or reading - sit, stand or lie in one position  for too long;  Sitting is especially hard on the spine so if you sit at a desk/use the computer, then stand up often!   Copyright  VHI. All rights reserved.  Posture - Standing   Good posture is important. Avoid slouching and forward head thrust. Maintain curve in low back and align ears over shoul- ders, hips over ankles.  Pull your belly   button in toward your back bone.   Copyright  VHI. All rights reserved.  Posture - Sitting   Sit upright, head facing forward. Try using a roll to support lower back. Keep shoulders relaxed, and avoid rounded back. Keep hips level with knees. Avoid crossing legs for long periods.   Copyright  VHI. All rights reserved.   

## 2014-05-08 NOTE — Therapy (Signed)
[facility] [address] [place], [place], [address] Phone: [phone]   Fax:  [phone]  Physical Therapy Evaluation  Patient Details  Name: Elaine Mcdonald MRN: [id number] Date of Birth: Jan 17, 1931 Referring Provider:  Nestor RampNeal, Sara L, MD  Encounter Date: 05/08/2014      PT End of Session - 05/08/14 1255    Visit Number 1   Number of Visits 12   Date for PT Re-Evaluation 06/19/14   PT Start Time 1155  late   PT Stop Time 1235   PT Time Calculation (min) 40 min   Activity Tolerance Patient tolerated treatment well      Past Medical History  Diagnosis Date  . Thyroid disease   . Anxiety   . Hypertension     Past Surgical History  Procedure Laterality Date  . Abdominal hysterectomy    . Tubal ligation    . Hip fusion      There were no vitals taken for this visit.  Visit Diagnosis:  Difficulty walking  Arthralgia of both knees  Hip joint pain, right      Subjective Assessment - 05/08/14 1159    Symptoms This patient presents with chronic hip and knee pain, abnormal gait and functional limitations.  She describes Rt>L. lateral knee pain with weightbearing. Pain also in Rt. hip in standing. She is sedentary and reports slowing down, fatiguing quickly with simple activities.     Pertinent History Rt THR (post)   Limitations Sitting;Standing;Walking;House hold activities  Stairs    How long can you stand comfortably? cannot stand comfortably   How long can you walk comfortably? cannot stand comfortably, but gets very tired   Diagnostic tests has from 2015   Currently in Pain? No/denies   Pain Score --  can be 5/10-6/10 with activity   Pain Location Knee   Pain Orientation Right;Left;Lateral  Rt>Lt   Pain Descriptors / Indicators Sore   Pain Type Chronic pain   Pain Onset More than a month ago   Pain Frequency Intermittent   Aggravating Factors  standing, stairs, walking   Pain Relieving Factors rest,     Multiple Pain Sites No          [facility] PT Assessment - 05/08/14 1207    Assessment   Medical Diagnosis bilateral knee pain   Onset Date --  chronic   Prior Therapy Not for knees    Precautions   Precautions None   Restrictions   Weight Bearing Restrictions No   Balance Screen   Has the patient fallen in the past 6 months No  However balance is "not good"   Has the patient had a decrease in activity level because of a fear of falling?  No   Is the patient reluctant to leave their home because of a fear of falling?  No   Home Environment   Living Enviornment Private residence   Posture/Postural Control   Posture/Postural Control Postural limitations   Postural Limitations Rounded Shoulders;Forward head;Flexed trunk;Weight shift left   AROM   Right Knee Extension 126   Right Knee Flexion 0   Left Knee Extension 3   Left Knee Flexion 125  painj end range IN the knee   Strength   Right Hip Flexion 4/5   Right Hip Extension 4/5   Right Hip ABduction 4+/5   Left Hip Flexion 4/5   Left Hip Extension 4/5   Left Hip ABduction 4/5   Right Knee Flexion 5/5  Right Knee Extension 4+/5   Left Knee Flexion 4+/5   Left Knee Extension 5/5   Flexibility   Soft Tissue Assessment /Muscle Lenght yes   Quadriceps --  tight, also tight iliopsoas   ITB --  tight   Palpation   Palpation sore lateral thigh ITB to knee Rt>Lt.                           PT Education - 25-May-2014 1255    Education provided Yes   Education Details PT/POC, posture and symmetry   Person(s) Educated Patient   Methods Explanation;Handout   Comprehension Verbalized understanding             PT Long Term Goals - 05/25/14 1301    PT LONG TERM GOAL #1   Title Pt will be I with HEP for core and LE strength/flexibility   Time 6   Period Weeks   Status New   PT LONG TERM GOAL #2   Title Pt will be able to walk in grocery store for 30 min with only min increase in pain   Time 6    Period Weeks   Status New   PT LONG TERM GOAL #3   Title Pt. will be able to do squat/bending activities without pain   Time 6   Period Weeks   Status New   PT LONG TERM GOAL #4   Title Pt. will be able to do a step up with Rt. LE and no increase in knee pain   Time 6   Period Weeks   PT LONG TERM GOAL #5   Title Pt. will be able to walk with improved symmetry and min cues   Time 6   Period Weeks   Status New               Plan - 05-25-14 1256    Clinical Impression Statement Patient is fairly strong overall but lacks core strength and hip strength L>Rt. She will benefit from postural/strength training to gain symmetry with walking and improve her ability to go up and down stairs with less pain.    Pt will benefit from skilled therapeutic intervention in order to improve on the following deficits Abnormal gait;Decreased range of motion;Difficulty walking;Impaired flexibility;Improper body mechanics;Postural dysfunction;Decreased endurance;Decreased activity tolerance;Obesity;Pain;Decreased balance;Decreased mobility;Decreased strength   Rehab Potential Good   PT Frequency 2x / week   PT Duration 6 weeks   PT Treatment/Interventions ADLs/Self Care Home Management;Moist Heat;Therapeutic activities;Patient/family education;Passive range of motion;DME Instruction;Therapeutic exercise;Gait training;Balance training;Manual techniques;Neuromuscular re-education;Stair training;Cryotherapy;Electrical Stimulation;Functional mobility training   PT Next Visit Plan establish HEP   PT Home Exercise Plan abdominal/core strength and anterior hip stretch (Thomas test)   Consulted and Agree with Plan of Care Patient          G-Codes - May 25, 2014 1308    Functional Assessment Tool Used clinical judgement   Functional Limitation Mobility: Walking and moving around   Mobility: Walking and Moving Around Current Status [phone]) At least 40 percent but less than 60 percent impaired, limited or  restricted   Mobility: Walking and Moving Around Goal Status [phone]) At least 20 percent but less than 40 percent impaired, limited or restricted       Problem List Patient Active Problem List   Diagnosis Date Noted  . Status post hip replacement 04/18/2014  . DJD (degenerative joint disease) of knee 04/18/2014  . Hip pain, chronic 04/18/2014  . External thrombosed  hemorrhoids 10/25/2013  . Pruritus ani 10/25/2013  . Dysphonia 05/31/2012    PAA,JENNIFER 05/08/2014, 1:09 PM  [facility] [address] [place], [place], [address] Phone: [phone]   Fax:  [phone]

## 2014-05-20 ENCOUNTER — Ambulatory Visit: Payer: Medicare Other | Admitting: Physical Therapy

## 2014-05-23 ENCOUNTER — Ambulatory Visit: Payer: Medicare Other | Admitting: Physical Therapy

## 2014-05-23 DIAGNOSIS — M25562 Pain in left knee: Secondary | ICD-10-CM

## 2014-05-23 DIAGNOSIS — R262 Difficulty in walking, not elsewhere classified: Secondary | ICD-10-CM

## 2014-05-23 DIAGNOSIS — M25551 Pain in right hip: Secondary | ICD-10-CM

## 2014-05-23 DIAGNOSIS — M25561 Pain in right knee: Secondary | ICD-10-CM

## 2014-05-23 NOTE — Therapy (Signed)
[place], [place], [address] Phone: [phone]   Fax:  [phone]  Physical Therapy Treatment  Patient Details  Name: Elaine Mcdonald MRN: [id number] Date of Birth: March 20, 1931 Referring Provider:  Aletha Halim., PA-C  Encounter Date: 05/23/2014      PT End of Session - 05/23/14 1028    Visit Number 2   Number of Visits 12   Date for PT Re-Evaluation 06/19/14   PT Start Time 1020   PT Stop Time 1112   PT Time Calculation (min) 52 min   Activity Tolerance Patient tolerated treatment well      Past Medical History  Diagnosis Date  . Thyroid disease   . Anxiety   . Hypertension     Past Surgical History  Procedure Laterality Date  . Abdominal hysterectomy    . Tubal ligation    . Hip fusion      There were no vitals taken for this visit.  Visit Diagnosis:  Difficulty walking  Arthralgia of both knees  Hip joint pain, right      Subjective Assessment - 05/23/14 1021    Symptoms Rt knee was giving me a fit Monday.  L possible plantar fasciitis.    Currently in Pain? Yes   Pain Score 5    Pain Location Knee   Pain Orientation Right   Pain Onset More than a month ago   Multiple Pain Sites Yes   Pain Score 5   Pain Type Chronic pain   Pain Location Foot   Pain Orientation Medial   Pain Frequency Occasional   Pain Onset On-going                    [facility] Adult PT Treatment/Exercise - 05/23/14 1029    Lumbar Exercises: Stretches   Single Knee to Chest Stretch 2 reps;30 seconds   Knee/Hip Exercises: Stretches   Hip Flexor Stretch 2 reps;30 seconds   ITB Stretch 2 reps;30 seconds   Knee/Hip Exercises: Aerobic   Stationary Bike NuStep level 6 UE and LE 5 min    Knee/Hip Exercises: Supine   Straight Leg Raises Strengthening;Both;1 set   Straight Leg Raise with External Rotation Strengthening;1 set;10 reps   Knee/Hip Exercises: Sidelying   Hip ABduction  Strengthening;1 set;10 reps   Hip ABduction Limitations bilat.    Clams x 20 each side   Modalities   Modalities Moist Heat   Moist Heat Therapy   Number Minutes Moist Heat 15 Minutes   Moist Heat Location Other (comment)  Rt.hip                     PT Long Term Goals - 05/08/14 1301    PT LONG TERM GOAL #1   Title Pt will be I with HEP for core and LE strength/flexibility   Time 6   Period Weeks   Status New   PT LONG TERM GOAL #2   Title Pt will be able to walk in grocery store for 30 min with only min increase in pain   Time 6   Period Weeks   Status New   PT LONG TERM GOAL #3   Title Pt. will be able to do squat/bending activities without pain   Time 6   Period Weeks   Status New   PT LONG TERM GOAL #4   Title Pt. will be able to do a step up with Rt. LE and no  increase in knee pain   Time 6   Period Weeks   PT LONG TERM GOAL #5   Title Pt. will be able to walk with improved symmetry and min cues   Time 6   Period Weeks   Status New               Plan - 05/23/14 1102    Clinical Impression Statement No goals met, 2nd visit.  Pt with more pain than eval today, had some difficulty with hip ex.  Able to establish HEP for her today.    PT Next Visit Plan review hip HEP and cont with endurance, general LE strength , bridges? ant hip    PT Home Exercise Plan hip abd, clam and SLR   Consulted and Agree with Plan of Care Patient        Problem List Patient Active Problem List   Diagnosis Date Noted  . Status post hip replacement 04/18/2014  . DJD (degenerative joint disease) of knee 04/18/2014  . Hip pain, chronic 04/18/2014  . External thrombosed hemorrhoids 10/25/2013  . Pruritus ani 10/25/2013  . Dysphonia 05/31/2012    PAA,JENNIFER 05/23/2014, 11:08 AM  [facility] [address] [place], [place], [address] Phone: [phone]   Fax:  [phone]

## 2014-05-23 NOTE — Patient Instructions (Signed)
Hip Flexion / Knee Extension: Straight-Leg Raise (Eccentric)   Lie on back. Lift leg with knee straight. Slowly lower leg for 3-5 seconds. __10_ reps per set, __1-2_ sets per day, _5__ days per week. Lower like elevator, stopping at each floor.    1 Set with toes out to the side, x 10 reps  Abduction: Clam (Eccentric) - Side-Lying Lie on side with knees bent. Lift top knee, keeping feet together. Keep trunk steady. Slowly lower for 3-5 seconds. _10__ reps per set, _1-2_ sets per day, _7__ days per week.    Abduction: Side Leg Lift (Eccentric) - Side-Lying   Lie on side. Lift top leg slightly higher than shoulder level. Keep top leg straight with body, toes pointing forward. Slowly lower for 3-5 seconds. _10__ reps per set, _1-2__ sets per day, __7_ days per week.  Copyright  VHI. All rights reserved.

## 2014-05-27 ENCOUNTER — Ambulatory Visit: Payer: Medicare Other | Admitting: Physical Therapy

## 2014-05-27 DIAGNOSIS — R262 Difficulty in walking, not elsewhere classified: Secondary | ICD-10-CM

## 2014-05-27 DIAGNOSIS — M25561 Pain in right knee: Secondary | ICD-10-CM

## 2014-05-27 DIAGNOSIS — M25562 Pain in left knee: Secondary | ICD-10-CM

## 2014-05-27 DIAGNOSIS — M25551 Pain in right hip: Secondary | ICD-10-CM

## 2014-05-27 NOTE — Therapy (Signed)
[facility] [address] [place], [place], [address] Phone: [phone]   Fax:  [phone]  Physical Therapy Treatment  Patient Details  Name: Elaine Mcdonald MRN: [id number] Date of Birth: 05-19-1930 Referring Provider:  Richmond CampbellKaplan, Kristen W., PA-C  Encounter Date: 05/27/2014      PT End of Session - 05/27/14 1337    Visit Number 3   Number of Visits 12   Date for PT Re-Evaluation 06/19/14   PT Start Time 1149   PT Stop Time 1234   PT Time Calculation (min) 45 min   Activity Tolerance Patient tolerated treatment well;Patient limited by pain  cramps      Past Medical History  Diagnosis Date  . Thyroid disease   . Anxiety   . Hypertension     Past Surgical History  Procedure Laterality Date  . Abdominal hysterectomy    . Tubal ligation    . Hip fusion      There were no vitals taken for this visit.  Visit Diagnosis:  Difficulty walking  Arthralgia of both knees  Hip joint pain, right      Subjective Assessment - 05/27/14 1153    Symptoms Knee pain 2/10 Was sore over the weekend from active walking  standing and the work out.  Has been doing her home exercise.  Lt hip hurt with walking Did not do her home exercises.    Currently in Pain? Yes   Pain Score 2    Pain Location Knee   Pain Orientation Left;Right   Pain Descriptors / Indicators Sore   Pain Type Chronic pain   Pain Radiating Towards hip   Aggravating Factors  stand walking, stairs   Pain Relieving Factors rest   Effect of Pain on Daily Activities limits                     [facility] Adult PT Treatment/Exercise - 05/27/14 1150    Ambulation/Gait   Gait Comments decreases weight shift Rt, decreased rotation, these improved after session today   Lumbar Exercises: Stretches   Quad Stretch 3 reps;30 seconds  over edge, with patellar glides distal , also anterior hip s   Knee/Hip Exercises: Dentisttretches   Quad Stretch Limitations  combined with anterior hip stretches   Hip Flexor Stretch 3 reps;20 seconds  over edge of the bed   Hip Flexor Stretch Limitations Hip adductor stretches, 3 reps 30 second holds.   Knee/Hip Exercises: Supine   Bridges Both   Bridges Limitations --  intermittant cramping ,    Patellar Mobs distal glides with quad stretch   Other Supine Knee Exercises Hip ER, feet together, intermittant cramps, added to home xerfcise                PT Education - 05/27/14 1336    Education provided Yes   Education Details stretches   Person(s) Educated Patient   Methods Explanation;Demonstration;Verbal cues;Handout   Comprehension Verbalized understanding;Returned demonstration             PT Long Term Goals - 05/27/14 1343    PT LONG TERM GOAL #1   Title Pt will be I with HEP for core and LE strength/flexibility   Time 6   Period Weeks   Status On-going   PT LONG TERM GOAL #2   Title Pt will be able to walk in grocery store for 30 min with only min increase in pain   Time 6   Period  Weeks   Status On-going   PT LONG TERM GOAL #3   Title Pt. will be able to do squat/bending activities without pain   Time 6   Period Weeks   Status On-going   PT LONG TERM GOAL #4   Title Pt. will be able to do a step up with Rt. LE and no increase in knee pain   Time 6   Period Weeks   Status Unable to assess   PT LONG TERM GOAL #5   Title Pt. will be able to walk with improved symmetry and min cues   Time 6   Period Weeks   Status On-going               Plan - 05/27/14 1339    Clinical Impression Statement Patient confessed she really did not want to attend PT ,but she was glad she did.  Gait improved with session.  ready for closed chain, encouraged compliance with home exercise.   PT Next Visit Plan add quad stretch to home? prone  with strap?  work toward goals        Problem List Patient Active Problem List   Diagnosis Date Noted  . Status post hip replacement  04/18/2014  . DJD (degenerative joint disease) of knee 04/18/2014  . Hip pain, chronic 04/18/2014  . External thrombosed hemorrhoids 10/25/2013  . Pruritus ani 10/25/2013  . Dysphonia 05/31/2012  Liz Beach, PTA 05/27/2014 1:45 PM Phone: [phone] Fax: [phone]   [facility] 05/27/2014, 1:45 PM  [facility] [address] [place], [place], [address] Phone: [phone]   Fax:  [phone]

## 2014-05-30 ENCOUNTER — Ambulatory Visit: Payer: Medicare Other | Admitting: Physical Therapy

## 2014-05-30 DIAGNOSIS — R262 Difficulty in walking, not elsewhere classified: Secondary | ICD-10-CM | POA: Diagnosis not present

## 2014-05-30 DIAGNOSIS — M25561 Pain in right knee: Secondary | ICD-10-CM

## 2014-05-30 DIAGNOSIS — M25551 Pain in right hip: Secondary | ICD-10-CM

## 2014-05-30 DIAGNOSIS — M25562 Pain in left knee: Secondary | ICD-10-CM

## 2014-05-30 NOTE — Therapy (Signed)
[facility] [address] [place], [place], [address] Phone: [phone]   Fax:  [phone]  Physical Therapy Treatment  Patient Details  Name: Elaine Mcdonald MRN: [id number] Date of Birth: June 23, 1930 Referring Provider:  Richmond CampbellKaplan, Kristen W., PA-C  Encounter Date: 05/30/2014      PT End of Session - 05/30/14 1140    Visit Number 4   Number of Visits 12   Date for PT Re-Evaluation 06/19/14   PT Start Time 1100   PT Stop Time 1100   PT Time Calculation (min) 0 min      Past Medical History  Diagnosis Date  . Thyroid disease   . Anxiety   . Hypertension     Past Surgical History  Procedure Laterality Date  . Abdominal hysterectomy    . Tubal ligation    . Hip fusion      There were no vitals taken for this visit.  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   Breathing exercise with green band to teach expanding diaphragm      [facility] Adult PT Treatment/Exercise - 05/30/14 1109    Lumbar Exercises: Stretches   Lower Trunk Rotation 5 reps   Pelvic Tilt 5 reps   Knee/Hip Exercises: Stretches   LobbyistQuad Stretch 3 reps;30 seconds   Quad Stretch Limitations combined with anterior hip stretches   Hip Flexor Stretch 2 reps;30 seconds   Hip Flexor Stretch Limitations standing   Knee/Hip Exercises: Supine   Bridges Both   Bridges Limitations --  cues for segmental movement   Other Supine Knee Exercises Hip ER/IR x 10 each side, Hip ER feet together   Other Supine Knee Exercises hip circles with black band    Knee/Hip Exercises: Sidelying   Clams x 20 each side                PT Education - 05/30/14  1140    Education Details HEP, breathing   Person(s) Educated Patient   Methods Explanation;Demonstration   Comprehension Verbalized understanding;Returned demonstration             PT Long Term Goals - 05/30/14 1144    PT LONG TERM GOAL #1   Title Pt will be I with HEP for core and LE strength/flexibility   Status On-going   PT LONG TERM GOAL #2   Title Pt will be able to walk in grocery store for 30 min with only min increase in pain   Status On-going   PT LONG TERM GOAL #3   Title Pt. will be able to do squat/bending activities without pain   Status On-going   PT LONG TERM GOAL #4   Title Pt. will be able to do a step up with Rt. LE and no increase in knee pain   Status On-going   PT LONG TERM GOAL #5   Title Pt. will be able to walk with improved symmetry and min cues   Status On-going               Plan - 05/30/14 1140    Clinical Impression Statement  Tolerated today well, needs cues for breathing and encouragement to exercise, moving along slowly. She was asked to do HEP this weekend.     PT Next Visit Plan work towards goals, in standing, check gait   PT Home Exercise Plan hip abd, clam and SLR   Consulted and Agree with Plan of Care Patient        Problem List Patient Active Problem List   Diagnosis Date Noted  . Status post hip replacement 04/18/2014  . DJD (degenerative joint disease) of knee 04/18/2014  . Hip pain, chronic 04/18/2014  . External thrombosed hemorrhoids 10/25/2013  . Pruritus ani 10/25/2013  . Dysphonia 05/31/2012    PAA,JENNIFER 05/30/2014, 11:50 AM  [facility] [address] [place], [place], [address] Phone: [phone]   Fax:  [phone]

## 2014-06-02 ENCOUNTER — Ambulatory Visit: Payer: Medicare Other | Admitting: Physical Therapy

## 2014-06-02 DIAGNOSIS — R262 Difficulty in walking, not elsewhere classified: Secondary | ICD-10-CM

## 2014-06-02 DIAGNOSIS — M25551 Pain in right hip: Secondary | ICD-10-CM

## 2014-06-02 DIAGNOSIS — M25561 Pain in right knee: Secondary | ICD-10-CM

## 2014-06-02 DIAGNOSIS — M25562 Pain in left knee: Secondary | ICD-10-CM

## 2014-06-02 NOTE — Therapy (Signed)
[facility] [address] [place], [place], [address] Phone: [phone]   Fax:  [phone]  Physical Therapy Treatment  Patient Details  Name: Elaine Mcdonald MRN: [id number] Date of Birth: 10/21/1930 Referring Provider:  Richmond CampbellKaplan, Elaine W., PA-C  Encounter Date: 06/02/2014      PT End of Session - 06/02/14 1134    Visit Number 5   Number of Visits 12   PT Start Time 1106      Past Medical History  Diagnosis Date  . Thyroid disease   . Anxiety   . Hypertension     Past Surgical History  Procedure Laterality Date  . Abdominal hysterectomy    . Tubal ligation    . Hip fusion      There were no vitals taken for this visit.  Visit Diagnosis:  Difficulty walking  Arthralgia of both knees  Hip joint pain, right      Subjective Assessment - 06/02/14 1111    Symptoms Sore and slow today. Seeing MD about her hoarseness.    Currently in Pain? Yes   Pain Score 4   soreness   Pain Location Leg   Pain Orientation Right;Left   Pain Descriptors / Indicators Sore   Pain Type Chronic pain                    [facility] Adult PT Treatment/Exercise - 06/02/14 1123    Knee/Hip Exercises: Aerobic   Stationary Bike NuStep level 3, 8 min       Pilates Reformer used for LE/core strength, postural strength, lumbopelvic disassociation and core control.  Exercises included: Isometric Transverse Abd  And pelvic tilt A/P Footwork: 2 Red 1 Blue parallel heels, forefoot, wide, single leg 2 Red for parallel and turn out.    Gastroc stretch and heel lifts 2 red 1 yellow  Gait with and without cane: Held cane in LUE  To bring weight over to L side, swing arms more naturally. She reports feeling weaker on L side with cane but does tend to have less pain on L side.          PT Long Term Goals - 05/30/14 1144    PT LONG TERM GOAL #1   Title Pt will be I with HEP for core and LE strength/flexibility   Status On-going    PT LONG TERM GOAL #2   Title Pt will be able to walk in grocery store for 30 min with only min increase in pain   Status On-going   PT LONG TERM GOAL #3   Title Pt. will be able to do squat/bending activities without pain   Status On-going   PT LONG TERM GOAL #4   Title Pt. will be able to do a step up with Rt. LE and no increase in knee pain   Status On-going   PT LONG TERM GOAL #5   Title Pt. will be able to walk with improved symmetry and min cues   Status On-going               Plan - 06/02/14 1135    PT Next Visit Plan cont to work on balancing LE/hip and core strength   Consulted and Agree with Plan of Care Patient        Problem List Patient Active Problem List   Diagnosis Date Noted  . Status post hip replacement 04/18/2014  . DJD (degenerative joint disease) of knee 04/18/2014  . Hip pain,  chronic 04/18/2014  . External thrombosed hemorrhoids 10/25/2013  . Pruritus ani 10/25/2013  . Dysphonia 05/31/2012    Elaine Mcdonald 06/02/2014, 11:42 AM  [facility] [address] [place], [place], [address] Phone: [phone]   Fax:  [phone]

## 2014-06-10 ENCOUNTER — Encounter: Payer: Medicare Other | Admitting: Physical Therapy

## 2014-06-16 ENCOUNTER — Ambulatory Visit: Payer: Medicare Other | Attending: Family Medicine | Admitting: Physical Therapy

## 2014-06-16 DIAGNOSIS — M25562 Pain in left knee: Secondary | ICD-10-CM | POA: Insufficient documentation

## 2014-06-16 DIAGNOSIS — M25561 Pain in right knee: Secondary | ICD-10-CM | POA: Diagnosis not present

## 2014-06-16 DIAGNOSIS — M25551 Pain in right hip: Secondary | ICD-10-CM

## 2014-06-16 DIAGNOSIS — R262 Difficulty in walking, not elsewhere classified: Secondary | ICD-10-CM

## 2014-06-16 NOTE — Therapy (Signed)
[place], [place], [address] Phone: [phone]   Fax:  [phone]  Physical Therapy Treatment  Patient Details  Name: Elaine Mcdonald MRN: [id number] Date of Birth: 29-Apr-1930 Referring Provider:  Aletha Halim., PA-C  Encounter Date: 06/16/2014      PT End of Session - 06/16/14 1528    Visit Number 6   Number of Visits 12   Date for PT Re-Evaluation 06/19/14   PT Start Time 6962   PT Stop Time 1236   PT Time Calculation (min) 51 min   Activity Tolerance Patient tolerated treatment well;Patient limited by pain      Past Medical History  Diagnosis Date  . Thyroid disease   . Anxiety   . Hypertension     Past Surgical History  Procedure Laterality Date  . Abdominal hysterectomy    . Tubal ligation    . Hip fusion      There were no vitals filed for this visit.  Visit Diagnosis:  Difficulty walking  Arthralgia of both knees  Hip joint pain, right      Subjective Assessment - 06/16/14 1226    Symptoms I have been hurting badly and have had a UTI for 3 weeks, was sick last week.     Currently in Pain? Yes   Pain Score 5    Pain Location Hip   Pain Orientation Right   Pain Descriptors / Indicators Sore   Pain Type Chronic pain   Pain Radiating Towards knee and into ower leg at times   Pain Onset More than a month ago   Pain Frequency Intermittent          [facility] Adult PT Treatment/Exercise - 06/16/14 1530    Knee/Hip Exercises: Stretches   ITB Stretch 3 reps;30 seconds   ITB Stretch Limitations against wall   Manual Therapy   Manual Therapy Massage;Myofascial release   Massage Rt. GR troch attachments, into Rt. ITB prox   Myofascial Release Rt. sidelying trunk, hip, ITB stretching           PT Education - 06/16/14 1527    Education provided Yes   Education Details ITB, stretch, ball   Person(s) Educated Patient   Methods Explanation;Handout   Comprehension  Verbalized understanding             PT Long Term Goals - 05/30/14 1144    PT LONG TERM GOAL #1   Title Pt will be I with HEP for core and LE strength/flexibility   Status On-going   PT LONG TERM GOAL #2   Title Pt will be able to walk in grocery store for 30 min with only min increase in pain   Status On-going   PT LONG TERM GOAL #3   Title Pt. will be able to do squat/bending activities without pain   Status On-going   PT LONG TERM GOAL #4   Title Pt. will be able to do a step up with Rt. LE and no increase in knee pain   Status On-going   PT LONG TERM GOAL #5   Title Pt. will be able to walk with improved symmetry and min cues   Status On-going               Plan - 06/16/14 1528    Clinical Impression Statement Patient responded well today to passive modailities, appreciated manual therapy and was given encouragement to walk, try HEP. No further goals met  PT Next Visit Plan cont to work on balancing LE/hip and core strength, Review HEP, renewal?   PT Home Exercise Plan hip abd, clam and SLR... gave ITB stretch today   Consulted and Agree with Plan of Care Patient        Problem List Patient Active Problem List   Diagnosis Date Noted  . Status post hip replacement 04/18/2014  . DJD (degenerative joint disease) of knee 04/18/2014  . Hip pain, chronic 04/18/2014  . External thrombosed hemorrhoids 10/25/2013  . Pruritus ani 10/25/2013  . Dysphonia 05/31/2012    Vergie Zahm 06/16/2014, 3:33 PM  [facility] [address] [place], [place], [address] Phone: [phone]   Fax:  [phone]

## 2014-06-16 NOTE — Patient Instructions (Addendum)
  Iliotibial Band Stretch, Standing   Stand, hands on hips, one leg crossed in front of other leg. Lean to same side as front leg until stretch is felt on other hip. Hold _30__ seconds. Change foot position and lean to same side. Hold _30__ seconds. Repeat _2-3__ times per session. Do 2_ sessions per day.  Copyright  VHI. All rights reserved.      http://orth.exer.us/1036  Iliotibial Band Syndrome Iliotibial band syndrome is pain in the outer, lower thigh. The pain is caused by an inflammation of the iliotibial band. This is a band of thick fibrous tissue that runs down the outside of the thigh. The iliotibial band begins at the hip. It extends to the outer side of the shin bone (tibia) just below the knee joint. The band works with the thigh muscles. Together they provide stability to the outside of the knee joint. Iliotibial band syndrome occurs when there is inflammation to this band of tissue. This is typically due to over use and not due to an injury. The irritation usually occurs over the outside of the knee joint, at the the end of the thigh bone (femur). The iliotibial band crosses bone and muscle at this point. Between these structures is a cushioning sac (bursa). The bursa should make possible a smooth gliding motion. However, when inflamed, the iliotibial band does not glide easily. When inflamed, there is pain with motion of the knee. Usually the pain worsens with continued movement and the pain goes away with rest. This problem usually arises when there is a sudden increase in sports activities involving your legs. Running, and playing soccer or basketball are examples of activities causing this. Others who are prone to iliotibial band syndrome include individuals with mechanical problems such as leg length differences, abnormality of walking, bowed legs etc. HOME CARE INSTRUCTIONS   Apply ice to the injured area:  Put ice in a plastic bag.  Place a towel between your skin and the  bag.  Leave the ice on for 20 minutes, 2-3 times a day.  Limit excessive training or eliminate training until pain goes away.  While pain is present, you may use gentle range of motion. Do not resume regular use until instructed by your health care provider. Begin use gradually. Do not increase activity to the point of pain. If pain does develop, decrease activity and continue the above measures. Gradually increase activities that do not cause discomfort. Do this until you finally achieve normal use.  Perform low-impact activities while pain is present. Wear proper footwear.  Only take over-the-counter or prescription medicines for pain, discomfort, or fever as directed by your health care provider. SEEK MEDICAL CARE IF:   Your pain increases or pain is not controlled with medications.  You develop new, unexplained symptoms, or an increase of the symptoms that brought you to your health care provider.  Your pain and symptoms are not improving or are getting worse. Document Released: 09/10/2001 Document Revised: 01/09/2013 Document Reviewed: 10/18/2012 [facility] Patient Information 2015 [employer] [place]. This information is not intended to replace advice given to you by your health care provider. Make sure you discuss any questions you have with your health care provider.   Tennis ball against wall for self massage of gluteal mm, warned against too much pressure on bony landmarks, to tol.

## 2014-06-24 ENCOUNTER — Encounter: Payer: Medicare Other | Admitting: Physical Therapy

## 2014-06-25 ENCOUNTER — Encounter: Payer: Self-pay | Admitting: Physical Therapy

## 2014-06-25 ENCOUNTER — Ambulatory Visit: Payer: Medicare Other | Admitting: Physical Therapy

## 2014-06-25 DIAGNOSIS — M25561 Pain in right knee: Secondary | ICD-10-CM

## 2014-06-25 DIAGNOSIS — R262 Difficulty in walking, not elsewhere classified: Secondary | ICD-10-CM

## 2014-06-25 DIAGNOSIS — M25551 Pain in right hip: Secondary | ICD-10-CM

## 2014-06-25 DIAGNOSIS — M25562 Pain in left knee: Secondary | ICD-10-CM

## 2014-06-25 NOTE — Therapy (Signed)
[place], [place], [address] Phone: [phone]   Fax:  [phone]  Physical Therapy Treatment  Patient Details  Name: Elaine Mcdonald MRN: [id number] Date of Birth: 1930-10-12 Referring Provider:  Aletha Halim., PA-C  Encounter Date: 06/25/2014      PT End of Session - 06/25/14 1338    Visit Number 7   Number of Visits 15   Date for PT Re-Evaluation 07/30/14   PT Start Time 9509      Past Medical History  Diagnosis Date  . Thyroid disease   . Anxiety   . Hypertension     Past Surgical History  Procedure Laterality Date  . Abdominal hysterectomy    . Tubal ligation    . Hip fusion      There were no vitals filed for this visit.  Visit Diagnosis:  Difficulty walking  Arthralgia of both knees  Hip joint pain, right      Subjective Assessment - 06/25/14 1341    Symptoms Soreness today, 6/10.  Bottom of foot hurts today, cramping.           [facility] PT Assessment - 06/25/14 1407    Strength   Right Hip ABduction 4/5   Ambulation/Gait   Ambulation/Gait Yes   Ambulation/Gait Assistance 6: Modified independent (Device/Increase time)   Ambulation Distance (Feet) 300 Feet   Gait Pattern Step-through pattern;Decreased step length - left;Decreased stride length   Ambulation Surface Level;Indoor   Gait Comments leans to Rt.           [facility] Adult PT Treatment/Exercise - 06/25/14 1407    Manual Therapy   Manual Therapy Massage;Myofascial release   Massage Rt. GR troch attachments, into Rt. ITB prox   Myofascial Release Rt. sidelying trunk, hip, ITB stretching  over pillow under Lt trunk     Tender/painful Rt. Anterior iliac crest (iliacus muscle) Able to get a good stretch for entire Rt. Side of body during and after manual therapy.         PT Education - 06/25/14 1414    Education provided Yes   Education Details renewal, goals, gait   Person(s) Educated Patient   Methods Explanation;Demonstration   Comprehension Verbalized understanding;Returned demonstration           PT Long Term Goals - 06/25/14 1342    PT LONG TERM GOAL #1   Title Pt will be I with HEP for core and LE strength/flexibility   Status On-going   PT LONG TERM GOAL #2   Title Pt will be able to walk in grocery store for 30 min with only min increase in pain   Baseline has not gone in several weeks   Status On-going   PT LONG TERM GOAL #3   Title Pt. will be able to do squat/bending activities without pain   Status Partially Met   PT LONG TERM GOAL #4   Title Pt. will be able to do a step up with Rt. LE and no increase in knee pain   PT LONG TERM GOAL #5   Title Pt. will be able to walk with improved symmetry and min cues   Status On-going               Plan - 06/25/14 1346    Clinical Impression Statement Patient with min improvement overall, she still feels she has not corrected her gait pattern yet.  She was sick for a couple of weeks.  Knee  is overal better, feels mostly pain in hip.  She will benefit from skilled PT to extend plan of care to improve mobility and gait.    PT Next Visit Plan Renewed for 4 more weeks.  Focus on gait, LE strength and offer soft tissue to lateral Rt. hip   PT Home Exercise Plan hip abd, clam and SLR., ITB stretch    Consulted and Agree with Plan of Care Patient        Problem List Patient Active Problem List   Diagnosis Date Noted  . Status post hip replacement 04/18/2014  . DJD (degenerative joint disease) of knee 04/18/2014  . Hip pain, chronic 04/18/2014  . External thrombosed hemorrhoids 10/25/2013  . Pruritus ani 10/25/2013  . Dysphonia 05/31/2012    , 06/25/2014, 2:50 PM  [facility] [address] [place], [place], [address] Phone: [phone]   Fax:  [phone]

## 2014-07-02 ENCOUNTER — Encounter: Payer: Medicare Other | Admitting: Rehabilitation

## 2014-07-02 ENCOUNTER — Ambulatory Visit: Payer: Medicare Other | Admitting: Physical Therapy

## 2014-07-02 DIAGNOSIS — R262 Difficulty in walking, not elsewhere classified: Secondary | ICD-10-CM | POA: Diagnosis not present

## 2014-07-02 DIAGNOSIS — M25561 Pain in right knee: Secondary | ICD-10-CM

## 2014-07-02 DIAGNOSIS — M25551 Pain in right hip: Secondary | ICD-10-CM

## 2014-07-02 DIAGNOSIS — M25562 Pain in left knee: Secondary | ICD-10-CM

## 2014-07-02 NOTE — Therapy (Deleted)
[facility] [address] [place], [place], [address] Phone: [phone]   Fax:  [phone]  Physical Therapy Treatment  Patient Details  Name: Elaine Mcdonald MRN: [id number] Date of Birth: 02-25-1931 Referring Provider:  Richmond Mcdonald., PA-C  Encounter Date: 07/02/2014      PT End of Session - 07/02/14 1452    Visit Number 8   Number of Visits 15   Date for PT Re-Evaluation 07/30/14   PT Start Time 1333   PT Stop Time 1423   PT Time Calculation (min) 50 min   Activity Tolerance Patient tolerated treatment well   Behavior During Therapy [facility] for tasks assessed/performed      Past Medical History  Diagnosis Date  . Thyroid disease   . Anxiety   . Hypertension     Past Surgical History  Procedure Laterality Date  . Abdominal hysterectomy    . Tubal ligation    . Hip fusion      There were no vitals filed for this visit.  Visit Diagnosis:  Difficulty walking  Arthralgia of both knees  Hip joint pain, right      Subjective Assessment - 07/02/14 1444    Symptoms Right knee hurts more than usual 7/10 due to weekend activities, Elaine Mcdonald was active at her church. Complains of left side low back pain, admits that does not do her HEP.     Currently in Pain? Yes   Pain Score 7    Pain Location Knee   Pain Orientation Right   Pain Descriptors / Indicators Aching   Pain Type Chronic pain   Pain Onset More than a month ago   Pain Frequency Intermittent   Multiple Pain Sites Yes   Pain Score --  moderate   Pain Location Back   Pain Orientation Lower;Left   Pain Type Chronic pain   Pain Frequency Occasional           [facility] Adult PT Treatment/Exercise - 07/02/14 1358    Lumbar Exercises: Stretches   Active Hamstring Stretch 2 reps;30 seconds   Single Knee to Chest Stretch 2 reps;30 seconds   Lower Trunk Rotation 5 reps;10 seconds  2 sets   ITB Stretch 2 reps;30 seconds   Knee/Hip Exercises: Aerobic    Stationary Bike Nu step level 4 UE and LE, 7 minutes   Knee/Hip Exercises: Standing   Forward Step Up 2 sets;Right;10 reps;Hand Hold: 1;Step Height: 4"   Step Down Right;1 set;15 reps;Hand Hold: 1;Step Height: 4"   Other Standing Knee Exercises hip extension Right 15 reps, 1 set   Other Standing Knee Exercises Hip abduction, right 15 reps, 1 set   Knee/Hip Exercises: Supine   Straight Leg Raises Strengthening;10 reps;Both   Straight Leg Raise with External Rotation Strengthening;Both;2 sets;10 reps   Other Supine Knee Exercises Clams, left and right, 10 reps, 2 sets with green band    Manual Therapy   Massage Scar region, adhesions   Myofascial Release Rt. hip, ITB, lower back          PT Education - 07/02/14 1449    Education provided Yes   Education Details How to incorporate HEP into daily activities, importance of HEP, massage teherapy    Person(s) Educated Patient   Comprehension Verbalized understanding           PT Long Term Goals - 07/02/14 1450    PT LONG TERM GOAL #1   Status On-going   PT LONG TERM GOAL #  2   Status On-going   PT LONG TERM GOAL #3   Status On-going   PT LONG TERM GOAL #4   Status Achieved   PT LONG TERM GOAL #5   Status On-going            Plan - 07/02/14 1454    Clinical Impression Statement Patient continues to improve in R LE strength, but needs encouragement to be compliant with HEP. Needs skilled  PT to continue improvement in LE strength, gait, and functional mobility.          PT Treatment/Interventions ADLs/Self Care Home Management;Moist Heat;Therapeutic activities;Patient/family education;Passive range of motion;DME Instruction;Therapeutic exercise;Gait training;Balance training;Manual techniques;Neuromuscular re-education;Stair training;Cryotherapy;Electrical Stimulation;Functional mobility training   PT Home Exercise Plan hip abd, clam and SLR., ITB stretch    Consulted and Agree with Plan of Care Patient         Problem List Patient Active Problem List   Diagnosis Date Noted  . Status post hip replacement 04/18/2014  . DJD (degenerative joint disease) of knee 04/18/2014  . Hip pain, chronic 04/18/2014  . External thrombosed hemorrhoids 10/25/2013  . Pruritus ani 10/25/2013  . Dysphonia 05/31/2012    Elaine MallardAna Cristina Mcdonald 07/02/2014, 3:05 PM  [facility] [address] [place], [place], [address] Phone: [phone]   Fax:  [phone]

## 2014-07-02 NOTE — Therapy (Signed)
[facility] Outpatient Rehabilitation [facility] [address] [place], [place], [address] Phone: [phone]   Fax:  [phone]  Physical Therapy Treatment  Patient Details  Name: Elaine Mcdonald MRN: [id number] Date of Birth: 02/20/1931 Referring Provider:  Richmond Campbell., PA-C  Encounter Date: 07/02/2014      PT End of Session - 07/02/14 1452    Visit Number 8   Number of Visits 15   Date for PT Re-Evaluation 07/30/14   PT Start Time 1333   PT Stop Time 1423   PT Time Calculation (min) 50 min   Activity Tolerance Patient tolerated treatment well   Behavior During Therapy [facility] for tasks assessed/performed      Past Medical History  Diagnosis Date  . Thyroid disease   . Anxiety   . Hypertension     Past Surgical History  Procedure Laterality Date  . Abdominal hysterectomy    . Tubal ligation    . Hip fusion      There were no vitals filed for this visit.  Visit Diagnosis:  Difficulty walking  Arthralgia of both knees  Hip joint pain, right      Subjective Assessment - 07/02/14 1444    Symptoms Right knee hurts more than usual 7/10 due to weekend activities, Staci was active at her church. Complains of left side low back pain, admits that does not do her HEP.     Currently in Pain? Yes   Pain Score 7    Pain Location Knee   Pain Orientation Right   Pain Descriptors / Indicators Aching   Pain Type Chronic pain   Pain Onset More than a month ago   Pain Frequency Intermittent   Multiple Pain Sites Yes   Pain Score --  moderate   Pain Location Back   Pain Orientation Lower;Left   Pain Type Chronic pain   Pain Frequency Occasional           [facility] Adult PT Treatment/Exercise - 07/02/14 1358    Lumbar Exercises: Stretches   Active Hamstring Stretch 2 reps;30 seconds   Single Knee to Chest Stretch 2 reps;30 seconds   Lower Trunk Rotation 5 reps;10 seconds  2 sets   ITB Stretch 2 reps;30 seconds   Knee/Hip Exercises: Aerobic    Stationary Bike Nu step level 4 UE and LE, 7 minutes   Knee/Hip Exercises: Standing   Forward Step Up 2 sets;Right;10 reps;Hand Hold: 1;Step Height: 4"   Step Down Right;1 set;15 reps;Hand Hold: 1;Step Height: 4"   Other Standing Knee Exercises hip extension Right 15 reps, 1 set   Other Standing Knee Exercises Hip abduction, right 15 reps, 1 set   Knee/Hip Exercises: Supine   Straight Leg Raises Strengthening;10 reps;Both   Straight Leg Raise with External Rotation Strengthening;Both;2 sets;10 reps   Other Supine Knee Exercises Clams, left and right, 10 reps, 2 sets with green band    Manual Therapy   Massage Scar region, adhesions   Myofascial Release Rt. hip, ITB, lower back           PT Education - 07/02/14 1449    Education provided Yes   Education Details How to incorporate HEP into daily activities, importance of HEP, massage teherapy    Person(s) Educated Patient   Comprehension Verbalized understanding             PT Long Term Goals - 07/02/14 1450    PT LONG TERM GOAL #1   Title Pt will be I  with HEP for core and LE strength/flexibility   Status On-going   PT LONG TERM GOAL #2   Title Pt will be able to walk in grocery store for 30 min with only min increase in pain   Status On-going   PT LONG TERM GOAL #3   Title Pt. will be able to do squat/bending activities without pain   Status On-going   PT LONG TERM GOAL #4   Title Pt. will be able to do a step up with Rt. LE and no increase in knee pain   Status --   PT LONG TERM GOAL #5   Status On-going           Plan - 07/02/14 1454    Clinical Impression Statement Patient continues to improve in R LE strength, but needs encouragement to be compliant with HEP. Needs skilled  PT to continue improvement in LE strength, gait, and functional mobility.          PT Treatment/Interventions ADLs/Self Care Home Management;Moist Heat;Therapeutic activities;Patient/family education;Passive range of motion;DME  Instruction;Therapeutic exercise;Gait training;Balance training;Manual techniques;Neuromuscular re-education;Stair training;Cryotherapy;Electrical Stimulation;Functional mobility training   PT Home Exercise Plan hip abd, clam and SLR., ITB stretch    Consulted and Agree with Plan of Care Patient       Problem List Patient Active Problem List   Diagnosis Date Noted  . Status post hip replacement 04/18/2014  . DJD (degenerative joint disease) of knee 04/18/2014  . Hip pain, chronic 04/18/2014  . External thrombosed hemorrhoids 10/25/2013  . Pruritus ani 10/25/2013  . Dysphonia 05/31/2012    Durward MallardAna Rosangela Fehrenbach 07/02/2014, 3:25 PM  [facility] [address] [place], [place], [address] Phone: [phone]   Fax:  [phone]

## 2014-07-03 ENCOUNTER — Encounter: Payer: Medicare Other | Admitting: Physical Therapy

## 2014-07-08 ENCOUNTER — Ambulatory Visit: Payer: Medicare Other | Attending: Family Medicine | Admitting: Physical Therapy

## 2014-07-08 DIAGNOSIS — M25562 Pain in left knee: Secondary | ICD-10-CM | POA: Insufficient documentation

## 2014-07-08 DIAGNOSIS — R262 Difficulty in walking, not elsewhere classified: Secondary | ICD-10-CM | POA: Diagnosis present

## 2014-07-08 DIAGNOSIS — M25551 Pain in right hip: Secondary | ICD-10-CM | POA: Insufficient documentation

## 2014-07-08 DIAGNOSIS — M25561 Pain in right knee: Secondary | ICD-10-CM | POA: Diagnosis not present

## 2014-07-08 NOTE — Therapy (Signed)
[facility] Outpatient Rehabilitation [facility] [address] [place], [place], [address] Phone: [phone]   Fax:  [phone]  Physical Therapy Treatment  Patient Details  Name: Elaine Mcdonald MRN: [id number] Date of Birth: 1930-07-03 Referring Provider:  Richmond Campbell., PA-C  Encounter Date: 07/08/2014      PT End of Session - 07/08/14 1445    Visit Number 9   Number of Visits 15   Date for PT Re-Evaluation 07/30/14   PT Start Time 1340   PT Stop Time 1428   PT Time Calculation (min) 48 min   Activity Tolerance Patient tolerated treatment well;Patient limited by pain   Behavior During Therapy [facility] for tasks assessed/performed      Past Medical History  Diagnosis Date  . Thyroid disease   . Anxiety   . Hypertension     Past Surgical History  Procedure Laterality Date  . Abdominal hysterectomy    . Tubal ligation    . Hip fusion      There were no vitals filed for this visit.  Visit Diagnosis:  Difficulty walking  Arthralgia of both knees  Hip joint pain, right      Subjective Assessment - 07/08/14 1344    Subjective Right knee aggrevated today, pt hasn't done HEP at home due to R knee pain. Pt was dizzy  driving and in the waiting area.     Currently in Pain? Yes   Pain Score 7    Pain Location Knee   Pain Orientation Right   Pain Descriptors / Indicators Aching   Pain Type Chronic pain   Pain Onset More than a month ago   Pain Frequency Intermittent   Multiple Pain Sites --  low back, not rated           [facility] Adult PT Treatment/Exercise - 07/08/14 1430    Knee/Hip Exercises: Aerobic   Stationary Bike Nu Step, level 6, 7 minutes   Knee/Hip Exercises: Standing   Forward Step Up 2 sets;Right;10 reps;Step Height: 4";Hand Hold: 0   Step Down Right;1 set;15 reps;Hand Hold: 1;Step Height: 4"   Functional Squat 15 reps;3 seconds   Functional Squat Limitations knee pain   Other Standing Knee Exercises hip extension Right 15  reps, 1 set   Other Standing Knee Exercises Hip abduction, right 15 reps, 1 set   Knee/Hip Exercises: Supine   Short Arc Quad Sets Both;Strengthening;2 sets;10 reps  ankle weights, 1 lb   Bridges Strengthening;Both;1 set;5 reps  Spasms in low back   Straight Leg Raises Strengthening;Both;2 sets;15 reps          PT Education - 07/08/14 1442    Education provided Yes   Education Details HEP   Person(s) Educated Patient   Methods Explanation;Demonstration;Tactile cues;Verbal cues   Comprehension Verbalized understanding;Returned demonstration          PT Long Term Goals - 07/08/14 1347    PT LONG TERM GOAL #1   Title Pt will be I with HEP for core and LE strength/flexibility   Status On-going   PT LONG TERM GOAL #2   Title Pt will be able to walk in grocery store for 30 min with only min increase in pain   Status On-going   PT LONG TERM GOAL #3   Title Pt. will be able to do squat/bending activities without pain   Status On-going   PT LONG TERM GOAL #4   Title Pt. will be able to do a step up with  Rt. LE and no increase in knee pain   Status On-going   PT LONG TERM GOAL #5   Title Pt. will be able to walk with improved symmetry and min cues   Status On-going          Plan - 07/08/14 1446    Clinical Impression Statement Pt complained of dizziness while driving here, but felt no dizziness during therapy. Progressed pt to balancing exercies, step up with no hand support, Saly tolerated well. Compliance with HEP is still a struggle, but she is motivated to get better and will attempt to incorporate HEP into daily routine.     PT Next Visit Plan Check compliance with HEP, continue step ups with no hand support, LE strengthening, and soft tissue.    PT Home Exercise Plan as previous plus hip extension   Consulted and Agree with Plan of Care Patient      Problem List Patient Active Problem List   Diagnosis Date Noted  . Status post hip replacement 04/18/2014  . DJD  (degenerative joint disease) of knee 04/18/2014  . Hip pain, chronic 04/18/2014  . External thrombosed hemorrhoids 10/25/2013  . Pruritus ani 10/25/2013  . Dysphonia 05/31/2012    Durward MallardAna Khilee Hendricksen 07/08/2014, 2:54 PM  [facility] [address] [place], [place], [address] Phone: [phone]   Fax:  [phone]

## 2014-07-10 ENCOUNTER — Encounter: Payer: Medicare Other | Admitting: Physical Therapy

## 2014-07-15 ENCOUNTER — Ambulatory Visit: Payer: Medicare Other | Admitting: Physical Therapy

## 2014-07-15 DIAGNOSIS — R262 Difficulty in walking, not elsewhere classified: Secondary | ICD-10-CM | POA: Diagnosis not present

## 2014-07-15 DIAGNOSIS — M25551 Pain in right hip: Secondary | ICD-10-CM

## 2014-07-15 DIAGNOSIS — M25562 Pain in left knee: Secondary | ICD-10-CM

## 2014-07-15 DIAGNOSIS — M25561 Pain in right knee: Secondary | ICD-10-CM

## 2014-07-15 NOTE — Therapy (Signed)
[facility] [address] [place], [place], [address] Phone: [phone]   Fax:  [phone]  Physical Therapy Treatment  Patient Details  Name: Elaine RosenthalSally C Mcdonald MRN: [id number] Date of Birth: 02/18/1931 Referring Provider:  Richmond CampbellKaplan, Kristen W., PA-C  Encounter Date: 07/15/2014      PT End of Session - 07/15/14 1156    Visit Number 10   Number of Visits 15   Date for PT Re-Evaluation 07/30/14   PT Start Time 1103   PT Stop Time 1155   PT Time Calculation (min) 52 min   Activity Tolerance Patient tolerated treatment well;Patient limited by pain   Behavior During Therapy [facility] for tasks assessed/performed      Past Medical History  Diagnosis Date  . Thyroid disease   . Anxiety   . Hypertension     Past Surgical History  Procedure Laterality Date  . Abdominal hysterectomy    . Tubal ligation    . Hip fusion      There were no vitals filed for this visit.  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 R knee hurts today 7/10.    Currently in Pain? Yes   Pain Score 7    Pain Location Knee   Pain Orientation Right   Pain Descriptors / Indicators Aching   Pain Type Chronic pain   Pain Onset More than a month ago   Multiple Pain Sites Yes  Left hip, not rated          [facility] PT Assessment - 07/15/14 1219    Ambulation/Gait   Stairs Yes  Pt wants to improve stair management, limited due to pain   Stairs Assistance 4: Min guard   Stair Management Technique Two rails;Step to pattern;Alternating pattern;Forwards   Number of Stairs 5  3 reps ascending and descending   Height of Stairs 6          [facility] Adult PT Treatment/Exercise - 07/15/14 1207    Knee/Hip Exercises: Aerobic   Stationary Bike NuStep 6 mins, level 6   Knee/Hip Exercises: Standing   Forward Step Up Right;2 sets;10 reps;Hand Hold: 1;Step Height: 4"   Forward Step Up Limitations pain R knee   Step Down Right;2 sets;5 reps;Step Height: 4";Hand Hold: 1   Manual Therapy   Massage Lt hip for pain   Myofascial Release Rt. glut trigger point release          PT Education - 07/15/14 1226    Education provided Yes   Education Details Stair management, HEP    Person(s) Educated Patient   Methods Explanation;Demonstration   Comprehension Verbalized understanding;Returned demonstration         G-Codes - 07/15/14 1316    Functional Assessment Tool Used clinical judgement   Functional Limitation Mobility: Walking and moving around   Mobility: Walking and Moving Around Current Status [phone](G8978) At least 40 percent but less than 60 percent impaired, limited or restricted   Mobility: Walking and Moving Around Goal Status [phone](G8979) At least 20 percent but less than 40 percent impaired, limited or restricted         PT Long Term Goals - 07/08/14 1347    PT LONG TERM GOAL #1   Title Pt will be I with HEP for  core and LE strength/flexibility   Status On-going   PT LONG TERM GOAL #2   Title Pt will be able to walk in grocery store for 30 min with only min increase in pain   Status On-going   PT LONG TERM GOAL #3   Title Pt. will be able to do squat/bending activities without pain   Status On-going   PT LONG TERM GOAL #4   Title Pt. will be able to do a step up with Rt. LE and no increase in knee pain   Status On-going   PT LONG TERM GOAL #5   Title Pt. will be able to walk with improved symmetry and min cues   Status On-going          Plan - 07/15/14 1241    Clinical Impression Statement [name] tolerated stair training well, she is avoiding R knee flexion aschending stairs due to pain and hip weakness. She is interested in group classes to continue strength and endurance training.    PT Next Visit Plan Nu Step, hip strengthening, discuss  group exercises, like water aerobics   PT Home Exercise Plan as previous   Consulted and Agree with Plan of Care Patient     Problem List Patient Active Problem List   Diagnosis Date Noted  . Status post hip replacement 04/18/2014  . DJD (degenerative joint disease) of knee 04/18/2014  . Hip pain, chronic 04/18/2014  . External thrombosed hemorrhoids 10/25/2013  . Pruritus ani 10/25/2013  . Dysphonia 05/31/2012    PAA,JENNIFER 07/15/2014, 1:18 PM  [facility] [address] [place], [place], [address] Phone: [phone]   Fax:  [phone]

## 2014-07-17 ENCOUNTER — Ambulatory Visit: Payer: Medicare Other | Admitting: Physical Therapy

## 2014-07-22 ENCOUNTER — Ambulatory Visit: Payer: Medicare Other | Admitting: Physical Therapy

## 2014-07-22 DIAGNOSIS — M25561 Pain in right knee: Secondary | ICD-10-CM

## 2014-07-22 DIAGNOSIS — M25562 Pain in left knee: Secondary | ICD-10-CM

## 2014-07-22 DIAGNOSIS — R262 Difficulty in walking, not elsewhere classified: Secondary | ICD-10-CM

## 2014-07-22 DIAGNOSIS — M25551 Pain in right hip: Secondary | ICD-10-CM

## 2014-07-22 NOTE — Therapy (Signed)
[facility] Outpatient Rehabilitation [facility] [address] [place], [place], [address] Phone: [phone]   Fax:  [phone]  Physical Therapy Treatment  Patient Details  Name: Elaine Mcdonald MRN: [id number] Date of Birth: [birth year] Referring Provider:  Richmond Campbell., PA-C  Encounter Date: 07/22/2014      PT End of Session - 07/22/14 1248    Visit Number 11   Number of Visits 15   Date for PT Re-Evaluation 07/30/14   PT Start Time 1107   PT Stop Time 1223   PT Time Calculation (min) 76 min   Activity Tolerance Patient tolerated treatment well   Behavior During Therapy [facility] for tasks assessed/performed      Past Medical History  Diagnosis Date  . Thyroid disease   . Anxiety   . Hypertension     Past Surgical History  Procedure Laterality Date  . Abdominal hysterectomy    . Tubal ligation    . Hip fusion      There were no vitals filed for this visit.  Visit Diagnosis:  Hip joint pain, right  Arthralgia of both knees  Difficulty walking      Subjective Assessment - 07/22/14 1118    Subjective Pt came using cane today due to increase in right hip and knee pain. She states that she hasn't been feeling well this past week, had a stomach virus and overall fatigued and in pain. States that did blood work 2.5 wks ago and found that she is B12 deficient, MD initiated B2 injections.    Currently in Pain? Yes   Pain Score 5    Pain Location Hip   Pain Orientation Right   Pain Radiating Towards towards right knee and right foot   Multiple Pain Sites No             [facility] Adult PT Treatment/Exercise - 07/22/14 1133    Lumbar Exercises: Stretches   Active Hamstring Stretch 2 reps;60 seconds;Other (comment)  both   ITB Stretch 2 reps;60 seconds  both   Piriformis Stretch 2 reps;30 seconds;Other (comment)  right   Knee/Hip Exercises: Aerobic   Stationary Bike NuStep 7 minutes, level 3   Knee/Hip Exercises: Supine   Heel Slides  AAROM;Right;2 sets;15 reps   Straight Leg Raises Right;2 sets;10 reps   Other Supine Knee Exercises clams x 15 both blue band   Manual Therapy   Manual Therapy --  Pt consents to TDN by Lenny Pastel PT   Myofascial Release piriformis, glut med, press release           Trigger Point Dry Needling - 07/22/14 1230    Consent Given? Yes  Yes, verbal consent to Garen Lah PT   Education Handout Provided Yes   Muscles Treated Lower Body --  dry needling performed by certofied PT Garen Lah   Gluteus Maximus Response Twitch response elicited   Gluteus Minimus Response Palpable increased muscle length   Piriformis Response Twitch response elicited;Palpable increased muscle length  small capillary bruise, pt made aware and aftercare            PT Education - 07/22/14 1246    Education provided Yes   Education Details Dry needling instructions, aftercare, and muscle anatomy    Person(s) Educated Patient   Methods Explanation;Handout   Comprehension Verbalized understanding            PT Long Term Goals - 07/22/14 1247    PT LONG TERM GOAL #1   Title Pt  will be I with HEP for core and LE strength/flexibility   Status On-going   PT LONG TERM GOAL #2   Title Pt will be able to walk in grocery store for 30 min with only min increase in pain   Status On-going   PT LONG TERM GOAL #3   Title Pt. will be able to do squat/bending activities without pain   Status On-going   PT LONG TERM GOAL #4   Title Pt. will be able to do a step up with Rt. LE and no increase in knee pain   Status On-going   PT LONG TERM GOAL #5   Title Pt. will be able to walk with improved symmetry and min cues   Status On-going            Plan - 07/22/14 1249    Clinical Impression Statement Elaine RoundsSally continues to have pain in her R hip that radiates down into her knee and foot. We did exercises today, stretches on the mat, strengthening of hips, and soft tissue work that she tolerated  well. Tried dry needling performed by certified PT Garen LahLawrie Beardsley, Elaine Mcdonald tolerated well and  stated she felt release of the hip muscles. We will assess long lasting effects on Thursday and determine if patient will cont solely for TPDN.     PT Next Visit Plan Assess dry needling effects, continue hip and knee strengthening, piriformis stretch, and hamstring, ITB stretches.    PT Home Exercise Plan as previous   Consulted and Agree with Plan of Care Patient        Problem List Patient Active Problem List   Diagnosis Date Noted  . Status post hip replacement 04/18/2014  . DJD (degenerative joint disease) of knee 04/18/2014  . Hip pain, chronic 04/18/2014  . External thrombosed hemorrhoids 10/25/2013  . Pruritus ani 10/25/2013  . Dysphonia 05/31/2012    PAA,JENNIFER 07/22/2014, 1:39 PM  [facility] [address] [place], [place], [address] Phone: [phone]   Fax:  [phone]

## 2014-07-24 ENCOUNTER — Ambulatory Visit: Payer: Medicare Other | Admitting: Physical Therapy

## 2014-07-24 DIAGNOSIS — M25551 Pain in right hip: Secondary | ICD-10-CM

## 2014-07-24 DIAGNOSIS — M25562 Pain in left knee: Secondary | ICD-10-CM

## 2014-07-24 DIAGNOSIS — M25561 Pain in right knee: Secondary | ICD-10-CM

## 2014-07-24 DIAGNOSIS — R262 Difficulty in walking, not elsewhere classified: Secondary | ICD-10-CM | POA: Diagnosis not present

## 2014-07-24 NOTE — Therapy (Signed)
[facility] Outpatient Rehabilitation [facility] [address] [place], [place], [address] Phone: [phone]   Fax:  [phone]  Physical Therapy Treatment  Patient Details  Name: Elaine Mcdonald MRN: [id number] Date of Birth: Oct 18, 1930 Referring Provider:  Richmond Campbell., PA-C  Encounter Date: 07/24/2014      PT End of Session - 07/24/14 1144    Visit Number 12   Number of Visits 15   Date for PT Re-Evaluation 07/30/14   PT Start Time 1020   PT Stop Time 1107   PT Time Calculation (min) 47 min   Activity Tolerance Patient tolerated treatment well      Past Medical History  Diagnosis Date  . Thyroid disease   . Anxiety   . Hypertension     Past Surgical History  Procedure Laterality Date  . Abdominal hysterectomy    . Tubal ligation    . Hip fusion      There were no vitals filed for this visit.  Visit Diagnosis:  Hip joint pain, right  Arthralgia of both knees  Difficulty walking      Subjective Assessment - 07/24/14 1023    Subjective No cane today she left it in the car,  Hip1/10  knee 6/10 Rt Dry needling very helpful.  She is not sure about continuing .     Pain Score 1                          [facility] Adult PT Treatment/Exercise - 07/24/14 1027    Lumbar Exercises: Stretches   Active Hamstring Stretch 30 seconds;3 reps   Single Knee to Chest Stretch --  2 reps 15 seconds hold   ITB Stretch 2 reps;60 seconds  both   Piriformis Stretch 2 reps;30 seconds;Other (comment)  right   Knee/Hip Exercises: Stretches   Passive Hamstring Stretch 3 reps;30 seconds   Knee/Hip Exercises: Aerobic   Stationary Bike Nu step 6 minutes   Knee/Hip Exercises: Supine   Heel Slides AAROM;Right;2 sets;15 reps  1 set   Other Supine Knee Exercises clam stretch irritated Rt low back   Manual Therapy   Manual Therapy --  showed patient how to do trigger point work with hands                 PT Education - 07/24/14  1143    Education provided Yes   Education Details showed patient how to do trigger point work with hand because she never got around to using tennis balls.   Methods Explanation;Demonstration   Comprehension Verbalized understanding;Returned demonstration             PT Long Term Goals - 07/22/14 1247    PT LONG TERM GOAL #1   Title Pt will be I with HEP for core and LE strength/flexibility   Status On-going   PT LONG TERM GOAL #2   Title Pt will be able to walk in grocery store for 30 min with only min increase in pain   Status On-going   PT LONG TERM GOAL #3   Title Pt. will be able to do squat/bending activities without pain   Status On-going   PT LONG TERM GOAL #4   Title Pt. will be able to do a step up with Rt. LE and no increase in knee pain   Status On-going   PT LONG TERM GOAL #5   Title Pt. will be able to walk with improved symmetry  and min cues   Status On-going               Plan - 07/24/14 1349    Clinical Impression Statement Patient unsure what to do about continueing PT for dry needling.  Exercise focus today on strengthening and flexibility. Patient's pain focus today on Knee and low back, hip  vs THR related pain.   PT Next Visit Plan Finalize home ex or schedule for dry needling.     Consulted and Agree with Plan of Care Patient        Problem List Patient Active Problem List   Diagnosis Date Noted  . Status post hip replacement 04/18/2014  . DJD (degenerative joint disease) of knee 04/18/2014  . Hip pain, chronic 04/18/2014  . External thrombosed hemorrhoids 10/25/2013  . Pruritus ani 10/25/2013  . Dysphonia 05/31/2012   Liz BeachKaren Hong Moring, PTA 07/24/2014 1:55 PM Phone: [phone] Fax: [phone]  [facility] Center[facility],Lazara Grieser 07/24/2014, 1:55 PM  [facility] [address] [place], [place], [address] Phone: [phone]   Fax:  [phone]

## 2014-07-29 ENCOUNTER — Ambulatory Visit: Payer: Medicare Other | Admitting: Physical Therapy

## 2014-07-29 DIAGNOSIS — M25561 Pain in right knee: Secondary | ICD-10-CM

## 2014-07-29 DIAGNOSIS — M25562 Pain in left knee: Principal | ICD-10-CM

## 2014-07-29 DIAGNOSIS — R262 Difficulty in walking, not elsewhere classified: Secondary | ICD-10-CM

## 2014-07-29 NOTE — Therapy (Signed)
[place], [place], [address] Phone: [phone]   Fax:  [phone]  Physical Therapy Treatment  Patient Details  Name: Elaine Mcdonald MRN: [id number] Date of Birth: [birth year] Referring Provider:  Aletha Halim., PA-C  Encounter Date: 07/29/2014      PT End of Session - 07/29/14 1144    Visit Number 13   Number of Visits 22   Date for PT Re-Evaluation 09/23/14   PT Start Time 1121   PT Stop Time 1208   PT Time Calculation (min) 47 min   Activity Tolerance Patient tolerated treatment well   Behavior During Therapy [facility] for tasks assessed/performed      Past Medical History  Diagnosis Date  . Thyroid disease   . Anxiety   . Hypertension     Past Surgical History  Procedure Laterality Date  . Abdominal hysterectomy    . Tubal ligation    . Hip fusion      There were no vitals filed for this visit.  Visit Diagnosis:  Arthralgia of both knees  Difficulty walking      Subjective Assessment - 07/29/14 1235    Subjective Elaine Mcdonald was 20 minutes late today, her car did not start.  She wants to continue PT, specifically dry needling for R hip and R knee pain, she states dry needling helped her hip. She continues to have pain in her R knee 6/10 and discomfort in her R hip 1/10.    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 cannot stand comfortably   How long can you walk comfortably? cannot walk comfortably, gets very tired   Diagnostic tests has from 2015   Patient Stated Goals Wants to be able to walk without pain    Currently in Pain? Yes   Pain Score 6    Pain Location Knee   Pain Orientation Right   Pain Descriptors / Indicators Aching   Pain Type Chronic pain   Multiple Pain Sites Yes  Rt hip, 1/10 describes as discomfort             [facility] PT Assessment - 07/29/14 1215    Strength   Right Hip Flexion 4/5   Right Hip Extension 4/5   Right Hip ABduction 4/5   Left Hip Flexion 4/5   Left Hip Extension 4/5   Left Hip ABduction 4/5   Right Knee Flexion 5/5   Right Knee Extension 5/5   Left Knee Flexion 5/5   Left Knee Extension 5/5           [facility] Adult PT Treatment/Exercise - 07/29/14 1227    Lumbar Exercises: Stretches   Active Hamstring Stretch 3 reps;30 seconds   ITB Stretch 3 reps;30 seconds   Manual Therapy   Myofascial Release R ITB distal attachment            PT Education - 07/29/14 1243    Education provided Yes   Education Details HEP: ITB stretch supine, hip strengthening, and dry needling for Rt knee pain    Person(s) Educated Patient   Methods Explanation;Demonstration;Tactile cues;Verbal cues;Handout   Comprehension Returned demonstration;Verbalized understanding;Verbal cues required      Spent time talking about dry needling for Rt knee, answered pt's questions about POC, HEP, and stretching, reviewed goals together, and made new goals.  PT Long Term Goals - 07/29/14 1131    PT LONG TERM GOAL #1   Title Pt will be I with HEP for core and LE strength/flexibility   Status Not Met   PT LONG TERM GOAL #2   Title Pt will be able to walk in grocery store for 30 min with only min increase in pain   Status Partially Met   PT LONG TERM GOAL #3   Title Pt. will be able to do squat/bending activities without pain   Baseline Doedsn't squat, but bending does not increase pain    Status Achieved   PT LONG TERM GOAL #4   Baseline Pt does not step up with R LE   Status Deferred   PT LONG TERM GOAL #5   Title Pt. will be able to walk with improved symmetry and min cues   Baseline When pt is aware of her walkng pattern and pays attention    Status Partially Met   Additional Long Term Goals   Additional Long Term Goals Yes   PT LONG TERM GOAL #6   Title Pt will report decreased  knee pain during ambulation (4/10 or less) in order to go grocery shopping with her husband   Baseline pain 6/10   Time 8   Period Weeks   Status New   PT LONG TERM GOAL #7   Title Pt will have improved hip strength to 4+/5 throughout to improve stability and balance during household activities    Baseline 4/5   Time 8   Period Weeks   Status New           Plan - 07/29/14 1252    Clinical Impression Statement Elaine Mcdonald decided to continue PT for another 8 weeks in order to continue strengthening hip musculature, stretching tight Rt ITB, and dry needling Rt hip and Rt knee to decrease trigger points and tightness for decreased pain during walking and ADLs. Pt was re-newed for another 8 weeks of treatment with dry needling certified PTs.    Pt will benefit from skilled therapeutic intervention in order to improve on the following deficits Abnormal gait;Decreased range of motion;Difficulty walking;Impaired flexibility;Improper body mechanics;Postural dysfunction;Decreased endurance;Decreased activity tolerance;Obesity;Pain;Decreased balance;Decreased mobility;Decreased strength   Rehab Potential Good   PT Frequency 2x / week   PT Duration 8 weeks   PT Treatment/Interventions ADLs/Self Care Home Management;Moist Heat;Therapeutic activities;Patient/family education;Passive range of motion;DME Instruction;Therapeutic exercise;Gait training;Balance training;Manual techniques;Neuromuscular re-education;Stair training;Cryotherapy;Electrical Stimulation;Functional mobility training;Dry needling   PT Next Visit Plan Stretch Rt ITB, hamstring, cont hip strengthening and dry needling.    PT Home Exercise Plan Added ITB stretch, standing hip strengthening into ABD and Ext.    Consulted and Agree with Plan of Care Patient        Problem List Patient Active Problem List   Diagnosis Date Noted  . Status post hip replacement 04/18/2014  . DJD (degenerative joint disease) of knee 04/18/2014   . Hip pain, chronic 04/18/2014  . External thrombosed hemorrhoids 10/25/2013  . Pruritus ani 10/25/2013  . Dysphonia 05/31/2012    Elaine Mcdonald 07/29/2014, 1:02 PM  [facility] [address] [place], [place], [address] Phone: [phone]   Fax:  [phone]

## 2014-07-29 NOTE — Patient Instructions (Signed)
Outer Hip Stretch: Reclined IT Band Stretch (Strap)   Strap around opposite foot, pull across only as far as possible with shoulders on mat. Hold for _5___ breaths. Repeat _3-4___ times each leg, right leg only.   Copyright  VHI. All rights reserved.  ABDUCTION: Standing (Active)   Stand, feet flat. Lift right leg out to side.  Complete _3__ sets of _10__ repetitions. Perform _2  sessions per day.  http://gtsc.exer.us/111   Copyright  VHI. All rights reserved.  EXTENSION: Standing (Active)   Stand, both feet flat. Draw right leg behind body as far as possible. Complete _3__ sets of _10__ repetitions. Perform __3-4_ sessions per day.  http://gtsc.exer.us/77   Copyright  VHI. All rights reserved.

## 2014-07-31 ENCOUNTER — Encounter: Payer: Medicare Other | Admitting: Physical Therapy

## 2014-08-11 ENCOUNTER — Ambulatory Visit: Payer: Medicare Other | Attending: Family Medicine | Admitting: Physical Therapy

## 2014-08-11 DIAGNOSIS — M25562 Pain in left knee: Secondary | ICD-10-CM | POA: Diagnosis not present

## 2014-08-11 DIAGNOSIS — M25561 Pain in right knee: Secondary | ICD-10-CM | POA: Diagnosis not present

## 2014-08-11 DIAGNOSIS — R531 Weakness: Secondary | ICD-10-CM

## 2014-08-11 DIAGNOSIS — M25551 Pain in right hip: Secondary | ICD-10-CM

## 2014-08-11 DIAGNOSIS — R262 Difficulty in walking, not elsewhere classified: Secondary | ICD-10-CM | POA: Insufficient documentation

## 2014-08-11 NOTE — Therapy (Signed)
[facility] [address] [place], [place], [address] Phone: [phone]   Fax:  [phone]  Physical Therapy Treatment  Patient Details  Name: Elaine Mcdonald MRN: [id number] Date of Birth: 10/19/1930 Referring Provider:  Richmond Campbell., PA-C  Encounter Date: 08/11/2014      PT End of Session - 08/11/14 1517    Visit Number 14   Number of Visits 22   Date for PT Re-Evaluation 09/23/14   PT Start Time 1421   PT Stop Time 1525   PT Time Calculation (min) 64 min   Activity Tolerance Patient tolerated treatment well   Behavior During Therapy [facility] for tasks assessed/performed      Past Medical History  Diagnosis Date  . Thyroid disease   . Anxiety   . Hypertension     Past Surgical History  Procedure Laterality Date  . Abdominal hysterectomy    . Tubal ligation    . Hip fusion      There were no vitals filed for this visit.  Visit Diagnosis:  Arthralgia of both knees  Difficulty walking  Hip joint pain, right  Generalized weakness      Subjective Assessment - 08/11/14 1422    Subjective I am having a hard time getting around.  I fixed food to take Mother's day. My hips hurt and my right knee is week.  It is difficult for me to come up a step without holding on.  My whole right side of my right hip hurts.   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 Yes   Pain Score 5    Pain Location Knee   Pain Orientation Right   Pain Descriptors / Indicators Aching;Discomfort   Pain Type Chronic pain   Pain Onset More than a month ago   Pain Frequency Intermittent   Aggravating Factors  standing walking , stairs                         [facility] Adult PT Treatment/Exercise - 08/11/14 1433    Knee/Hip Exercises: Stretches    Passive Hamstring Stretch 2 reps;60 seconds  right side VC and TC with strap   ITB Stretch 60 seconds;2 reps  with strap in supine Right LE over Left shoulder   Piriformis Stretch 2 reps;60 seconds  by PT assist for R LE   Knee/Hip Exercises: Standing   Other Standing Knee Exercises sit to stand x 10 with UE support by PT   Moist Heat Therapy   Number Minutes Moist Heat 15 Minutes   Moist Heat Location --  Right hip and right knee   Manual Therapy   Manual Therapy Myofascial release   Myofascial Release R ITB and rectus femoris STW and DTW with IASYM tool in left sidelying.  Pt also in prone with PT DTW to Right Piriformis with IR /ER of R LE concurrently    Passive ROM Piriformis stretch and rectus femoris with PT assist for over stretch,           Trigger Point Dry Needling - 08/11/14 1429    Consent Given? Yes   Education Handout Provided No  Previously given   Muscles Treated Lower Body Quadriceps;Hamstring;Gluteus maximus;Gluteus minimus;Piriformis   Gluteus Maximus Response Twitch response elicited;Palpable  increased muscle length   Gluteus Minimus Response Twitch response elicited;Palpable increased muscle length   Piriformis Response Twitch response elicited;Palpable increased muscle length   Quadriceps Response Twitch response elicited;Palpable increased muscle length  rectus femoris and Lateralis   Hamstring Response Twitch response elicited;Palpable increased muscle length     right side only TDN         PT Education - 08/11/14 1516    Education provided Yes   Education Details Pt educated on IT B stretch supine/hamstring and given handout using strap   Person(s) Educated Patient   Methods Explanation;Demonstration;Tactile cues;Verbal cues;Handout   Comprehension Verbalized understanding;Returned demonstration;Need further instruction             PT Long Term Goals - 08/11/14 1526    PT LONG TERM GOAL #1   Title Pt will be I with HEP for core and  LE strength/flexibility   Time 6   Period Weeks   Status On-going   PT LONG TERM GOAL #2   Title Pt will be able to walk in grocery store for 30 min with only min increase in pain   Time 6   Period Weeks   Status On-going   PT LONG TERM GOAL #3   Title Pt. will be able to do squat/bending/sit to stand activities without pain   Time 6   Period Weeks   Status Revised   PT LONG TERM GOAL #4   Title Pt. will be able to do a step up with Rt. LE with UE support and minimal pain in R knee   Time 6   Period Weeks   Status Revised   PT LONG TERM GOAL #5   Title Pt. will be able to walk with improved symmetry and min cues   Time 6   Period Weeks   Status On-going   PT LONG TERM GOAL #6   Title Pt will report decreased knee pain during ambulation (4/10 or less) in order to go grocery shopping with her husband   Baseline pain 6/10   Time 8   Period Weeks   Status On-going   PT LONG TERM GOAL #7   Title Pt will have improved hip strength to 4+/5 throughout to improve stability and balance during household activities    Time 8   Period Weeks   Status On-going               Plan - 08/11/14 1518    Clinical Impression Statement Pt states she has some relief from Dry needling for pain, but she realizes and PT emphasized need for Pt to continue hip /core strengthening.  Pt stated she was having problems moving and getting around mostly due to discomfort and weakness.  Pt  understands importance of combining stretches and strengthening with trigger point dry needlling modality.    Pt will benefit from skilled therapeutic intervention in order to improve on the following deficits Abnormal gait;Decreased range of motion;Difficulty walking;Impaired flexibility;Improper body mechanics;Postural dysfunction;Decreased endurance;Decreased activity tolerance;Obesity;Pain;Decreased balance;Decreased mobility;Decreased strength   Rehab Potential Good   PT Frequency 2x / week   PT Duration 8 weeks    PT Treatment/Interventions ADLs/Self Care Home Management;Moist Heat;Therapeutic activities;Patient/family education;Passive range of motion;DME Instruction;Therapeutic exercise;Gait training;Balance training;Manual techniques;Neuromuscular re-education;Stair training;Cryotherapy;Electrical Stimulation;Functional mobility training;Dry needling   PT Next Visit Plan Review hip strengthening and continue trigger point dry needling        Problem List Patient Active Problem List   Diagnosis Date Noted  . Status post  hip replacement 04/18/2014  . DJD (degenerative joint disease) of knee 04/18/2014  . Hip pain, chronic 04/18/2014  . External thrombosed hemorrhoids 10/25/2013  . Pruritus ani 10/25/2013  . Dysphonia 05/31/2012    Garen LahLawrie Jaiyanna Safran, PT 08/11/2014 3:35 PM Phone: [phone] Fax: [phone]  [facility] [address] [place], [place], [address] Phone: [phone]   Fax:  [phone]

## 2014-08-11 NOTE — Patient Instructions (Signed)
Hamstring stretch supine Hamstring Step 2  Right  foot relaxed, knee straight, left leg bent, foot flat. Use strap and bring leg up with knee straight until you feel a slight pull. Hold 60___ seconds. Relax leg completely down. Repeat _2-3__ times.  May do left leg the same  For I T band. Do the same but bring Right great toe after knee straight on right over left shoulder to feell lateral pull on right.  Garen LahLawrie Maryetta Shafer, PT 08/11/2014 3:09 PM Phone: [phone] Fax: [phone]   Copyright  VHI. All rights reserved.

## 2014-08-18 ENCOUNTER — Ambulatory Visit: Payer: Medicare Other | Admitting: Physical Therapy

## 2014-08-18 DIAGNOSIS — R262 Difficulty in walking, not elsewhere classified: Secondary | ICD-10-CM

## 2014-08-18 DIAGNOSIS — M25562 Pain in left knee: Principal | ICD-10-CM

## 2014-08-18 DIAGNOSIS — M25551 Pain in right hip: Secondary | ICD-10-CM

## 2014-08-18 DIAGNOSIS — R531 Weakness: Secondary | ICD-10-CM

## 2014-08-18 DIAGNOSIS — M25561 Pain in right knee: Secondary | ICD-10-CM

## 2014-08-18 NOTE — Therapy (Signed)
[facility] [address] [place], [place], [address] Phone: [phone]   Fax:  [phone]  Physical Therapy Treatment  Patient Details  Name: Elaine Mcdonald MRN: [id number] Date of Birth: 14-Apr-1930 Referring Provider:  Richmond CampbellKaplan, Kristen W., PA-C  Encounter Date: 08/18/2014      PT End of Session - 08/18/14 1522    Visit Number 15   Number of Visits 22   Date for PT Re-Evaluation 09/23/14   PT Start Time 1418   PT Stop Time 1530   PT Time Calculation (min) 72 min   Activity Tolerance Patient tolerated treatment well   Behavior [facility] for tasks assessed/performed      Past Medical History  Diagnosis Date  . Thyroid disease   . Anxiety   . Hypertension     Past Surgical History  Procedure Laterality Date  . Abdominal hysterectomy    . Tubal ligation    . Hip fusion      There were no vitals filed for this visit.  Visit Diagnosis:  Arthralgia of both knees  Difficulty walking  Hip joint pain, right  Generalized weakness      Subjective Assessment - 08/18/14 1422    Subjective I am not doing the exercises as much as I need to.  I do think the dry needling really makes a difference.  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 Yes   Pain Score 0-No pain   Pain Location Knee   Pain Orientation Right   Pain Score 4   Pain Location Back   Pain Orientation Right;Left   Pain Type Chronic pain   Pain Frequency Occasional            [facility] PT Assessment - 08/18/14 0001    AROM   Right Knee Extension 0   Right Knee Flexion 130   Left Knee Extension 3   Left Knee Flexion 127  no pain   Strength   Right Hip Flexion 4/5   Right Hip Extension 4/5   Right Hip ABduction 4/5   Left Hip Flexion 4/5   Left Hip Extension 4/5   Left Hip ABduction 4/5   Right Knee Flexion 5/5   Right Knee  Extension 5/5   Left Knee Flexion 5/5   Left Knee Extension 5/5                     [facility] Adult PT Treatment/Exercise - 08/18/14 1437    Lumbar Exercises: Stretches   Active Hamstring Stretch 30 seconds;2 reps  right and left with strap   Single Knee to Chest Stretch 3 reps;10 seconds   Double Knee to Chest Stretch 3 reps;10 seconds   Lower Trunk Rotation 30 seconds;2 reps  right and left   Knee/Hip Exercises: Standing   Lateral Step Up 1 set;10 reps;Step Height: 6";Hand Hold: 1  right and left   Forward Step Up 10 reps;Hand Hold: 1;Step Height: 6"  right and left   Other Standing Knee Exercises sit to stand x 10 with UE    Other Standing Knee Exercises eccentric lowering of right leg with bend of left leg with soft knee 10 x   Knee/Hip Exercises: Supine   Bridges 2 sets;10 reps   Bridges Limitations lifts only 3/4 on way.   Moist Heat Therapy  Number Minutes Moist Heat 15 Minutes   Moist Heat Location --  right hip and right knee   Manual Therapy   Manual Therapy Myofascial release   Myofascial Release R ITB and rectus femoris STW and DTW with IASYM tool in left sidelying.     Passive ROM Piriformis stretch and rectus femoris with PT assist for over stretch,           Trigger Point Dry Needling - 08/18/14 1439    Consent Given? Yes   Education Handout Provided No  previously given   Muscles Treated Lower Body Gluteus minimus;Gluteus maximus;Piriformis   Gluteus Maximus Response Twitch response elicited;Palpable increased muscle length   Gluteus Minimus Response Twitch response elicited;Palpable increased muscle length   Piriformis Response Twitch response elicited;Palpable increased muscle length   Quadriceps Response Twitch response elicited;Palpable increased muscle length   Hamstring Response Twitch response elicited;Palpable increased muscle length     right side only         PT Education - 08/18/14 1437    Education provided Yes   Education  Details Pt given up date of HEP for standing/step ups.  Pt also given information about exercise group to help with consistent exercise   Person(s) Educated Patient   Methods Explanation;Demonstration;Tactile cues;Handout;Verbal cues   Comprehension Verbalized understanding;Returned demonstration             PT Long Term Goals - 08/18/14 1524    PT LONG TERM GOAL #1   Title Pt will be I with HEP for core and LE strength/flexibility   Time 6   Period Weeks   Status On-going   PT LONG TERM GOAL #2   Title Pt will be able to walk in grocery store for 30 min with only min increase in pain  Pt can walk 25-30 minutes but no more   Time 6   Period Weeks   Status On-going   PT LONG TERM GOAL #3   Title Pt. will be able to do squat/bending/sit to stand activities without pain   Time 6   Period Weeks   Status Revised   PT LONG TERM GOAL #4   Title Pt. will be able to do a 10 step up with Rt. LE with UE support and minimal pain in R knee  Pt with 3/10 today   Time 6   Period Weeks   Status Revised   PT LONG TERM GOAL #5   Title Pt. will be able to walk with improved symmetry and min cues   Time 6   Period Weeks   Status On-going   PT LONG TERM GOAL #6   Title Pt will report decreased knee pain during ambulation (4/10 or less) in order to go grocery shopping with her husband   Time 8   Period Weeks   Status On-going   PT LONG TERM GOAL #7   Title Pt will have improved hip strength to 4+/5 throughout to improve stability and balance during household activities   4/5   Time 8   Period Weeks   Status On-going               Plan - 08/18/14 1529    Clinical Impression Statement Pt with no pain in Right knee upon entering clinic post dry needling last session. Pt with same muscle strength and admits to not doing exercise as much as she should.  Pt encouraged to join group after dry needling appt next visit to maximize strength and  function.  Pt is able to get in and out of  car easier and with less pain in Right hip.  Pt  was  progressed in standing exercises but in the past has not been as consistent with exercise.  Pt verbalizes understanding and importance of exercise/stretches especially post DC    Pt will benefit from skilled therapeutic intervention in order to improve on the following deficits Abnormal gait;Decreased range of motion;Difficulty walking;Impaired flexibility;Improper body mechanics;Postural dysfunction;Decreased endurance;Decreased activity tolerance;Obesity;Pain;Decreased balance;Decreased mobility;Decreased strength   PT Frequency 2x / week   PT Duration 8 weeks   PT Next Visit Plan Review hip strengthening/ step ups. Pt should go to group exercise post dry needling.  May be ready to DC after next dry needling visit. Do FOTO/        Problem List Patient Active Problem List   Diagnosis Date Noted  . Status post hip replacement 04/18/2014  . DJD (degenerative joint disease) of knee 04/18/2014  . Hip pain, chronic 04/18/2014  . External thrombosed hemorrhoids 10/25/2013  . Pruritus ani 10/25/2013  . Dysphonia 05/31/2012   Garen LahLawrie Mykel Sponaugle, PT 08/18/2014 3:45 PM Phone: [phone] Fax: [phone]   [facility] [address] [place], [place], [address] Phone: [phone]   Fax:  [phone]

## 2014-08-18 NOTE — Patient Instructions (Signed)
Step Down: Anterior .  Step-Up: Lateral   Step up to side with right leg. Bring other foot up onto __6__ inch step. Return to floor position with left leg. Repeat ___10_ times per session. Do _1-2___ sessions per day. Repeat in dimly lit room. Repeat with eyes closed.  Copyright  VHI. All rights reserved.  Forward   Facing step, place one leg on step, flexed at hip. Step up slowly, bringing hips in line with knee and shoulder. Bring other foot onto step. Reverse process to step back down. Repeat with other leg. Do __10__ repetitions, __2__ sets.  http://bt.exer.us/154   Copyright  VHI. All rights reserved.   Eccentric lowering of Right leg on step with left leg on step.  Hold onto counter or sink and lower leg with knee softly bent  10 x  1-2 sessions  Garen LahLawrie Lelia Jons, PT 08/18/2014 2:36 PM Phone: [phone] Fax: [phone]

## 2014-08-26 ENCOUNTER — Ambulatory Visit: Payer: Medicare Other | Admitting: Physical Therapy

## 2014-08-26 DIAGNOSIS — R531 Weakness: Secondary | ICD-10-CM

## 2014-08-26 DIAGNOSIS — M25562 Pain in left knee: Principal | ICD-10-CM

## 2014-08-26 DIAGNOSIS — R262 Difficulty in walking, not elsewhere classified: Secondary | ICD-10-CM | POA: Diagnosis not present

## 2014-08-26 DIAGNOSIS — M25561 Pain in right knee: Secondary | ICD-10-CM

## 2014-08-26 DIAGNOSIS — M25551 Pain in right hip: Secondary | ICD-10-CM

## 2014-08-26 NOTE — Therapy (Signed)
[place], [place], [address] Phone: [phone]   Fax:  [phone]  Physical Therapy Treatment/Discharge Summary  Patient Details  Name: Elaine Mcdonald MRN: [id number] Date of Birth: 02/03/1931 Referring Provider:  Aletha Halim., PA-C  Encounter Date: 08/26/2014      PT End of Session - 08/26/14 1525    Visit Number 16   Number of Visits 22   Date for PT Re-Evaluation 09/23/14   PT Start Time 9767   PT Stop Time 1515   PT Time Calculation (min) 55 min   Activity Tolerance Patient tolerated treatment well      Past Medical History  Diagnosis Date  . Thyroid disease   . Anxiety   . Hypertension     Past Surgical History  Procedure Laterality Date  . Abdominal hysterectomy    . Tubal ligation    . Hip fusion      There were no vitals filed for this visit.  Visit Diagnosis:  Arthralgia of both knees  Difficulty walking  Hip joint pain, right  Generalized weakness      Subjective Assessment - 08/26/14 1421    Subjective (p) It's been a busy week with my husband's birthday and I haven't been good about doing the exercises.  I think the needling is helpful.  My leg is not as sore as it was.     How long can you walk comfortably? (p) on level ground could walk 10 min    Currently in Pain? (p) No/denies   Pain Score (p) 0-No pain            [facility] PT Assessment - 08/26/14 1517    Observation/Other Assessments   Focus on Therapeutic Outcomes (FOTO)  50%   Strength   Right Hip Extension 4+/5   Right Hip ABduction 4+/5   Left Hip Extension 4+/5   Left Hip ABduction 4+/5   Right Knee Extension 5/5   Left Knee Extension 5/5                     [facility] Adult PT Treatment/Exercise - 08/26/14 1518    Knee/Hip Exercises: Aerobic   Stationary Bike Nu-Step L2 9 min seat 9 arms 9   Knee/Hip Exercises: Standing   Lateral Step Up Both;1 set;10 reps;Hand Hold: 1;Step  Height: 4"   Forward Step Up Both;1 set;10 reps;Step Height: 4";Hand Hold: 1   Other Standing Knee Exercises Sit to stand without UEs                PT Education - 08/26/14 1525    Education provided Yes   Education Details HEP review and info of exercise group re-issued   Person(s) Educated Patient   Methods Explanation;Demonstration;Handout   Comprehension Verbalized understanding;Returned demonstration             PT Long Term Goals - 08/26/14 1529    PT LONG TERM GOAL #1   Title Pt will be I with HEP for core and LE strength/flexibility   Status Achieved   PT LONG TERM GOAL #2   Title Pt will be able to walk in grocery store for 30 min with only min increase in pain   Status Not Met   PT LONG TERM GOAL #3   Title Pt. will be able to do squat/bending/sit to stand activities without pain   Status Partially Met   PT LONG TERM GOAL #4   Title Pt. will  be able to do a 10 step up with Rt. LE with UE support and minimal pain in R knee   Status Partially Met   PT LONG TERM GOAL #5   Title Pt. will be able to walk with improved symmetry and min cues   Status Achieved   PT LONG TERM GOAL #6   Title Pt will report decreased knee pain during ambulation (4/10 or less) in order to go grocery shopping with her husband   Status Not Met   PT LONG TERM GOAL #7   Title Pt will have improved hip strength to 4+/5 throughout to improve stability and balance during household activities    Status Achieved               Plan - 2014-09-11 1526    Clinical Impression Statement The patient has met max potential with rehab goals at this time.  She has been seen for 16 visits with treatment interventions including manual therapy, dry needling to address myofascial pain, therapeutic exercise and modalities.  She currently reports no pain but pain will exacerbate with standing and walking 5-10 min.  She admits to not being very compliant with her HEP so far but expresses interest in a  group exercise class at our facility.  She plans to start this next week. Patient has made gains in hip strength and pain reduction.   Recommend discharge from PT at this time with partial goals met.            G-Codes - 11-Sep-2014 1530    Functional Assessment Tool Used clinical judgement   Functional Limitation Mobility: Walking and moving around   Mobility: Walking and Moving Around Goal Status [phone]) At least 20 percent but less than 40 percent impaired, limited or restricted   Mobility: Walking and Moving Around Discharge Status [phone]) At least 40 percent but less than 60 percent impaired, limited or restricted      Problem List Patient Active Problem List   Diagnosis Date Noted  . Status post hip replacement 04/18/2014  . DJD (degenerative joint disease) of knee 04/18/2014  . Hip pain, chronic 04/18/2014  . External thrombosed hemorrhoids 10/25/2013  . Pruritus ani 10/25/2013  . Dysphonia 05/31/2012    Alvera Singh 2014/09/11, 3:31 PM  [facility] [address] [place], [place], [address] Phone: [phone]   Fax:  [phone]   PHYSICAL THERAPY DISCHARGE SUMMARY  Visits from Start of Care: 16  Current functional level related to goals / functional outcomes: Partial goals met.   Remaining deficits: See clinical impressions above   Education / Equipment: HEP/ group exercise class info Plan: Patient agrees to discharge.  Patient goals were partially met. Patient is being discharged due to meeting the stated rehab goals.  ?????   Ruben Im, PT 2014-09-11 3:33 PM Phone: [phone] Fax: [phone]

## 2014-08-26 NOTE — Patient Instructions (Signed)
Long discussion of HEP including group exercise class benefits and structure.  Extensive time instructing patient in self set up of Nu-Step during "open" hours as part of group class.  Patient plans to start next week.

## 2015-04-14 ENCOUNTER — Encounter (HOSPITAL_COMMUNITY): Payer: Self-pay | Admitting: *Deleted

## 2015-04-14 ENCOUNTER — Emergency Department (HOSPITAL_COMMUNITY)
Admission: EM | Admit: 2015-04-14 | Discharge: 2015-04-14 | Disposition: A | Discharge status: Home / Self Care | Payer: Medicare Other | Attending: Emergency Medicine | Admitting: Emergency Medicine

## 2015-04-14 ENCOUNTER — Emergency Department (HOSPITAL_COMMUNITY): Payer: Medicare Other

## 2015-04-14 DIAGNOSIS — F419 Anxiety disorder, unspecified: Secondary | ICD-10-CM | POA: Diagnosis not present

## 2015-04-14 DIAGNOSIS — M109 Gout, unspecified: Secondary | ICD-10-CM

## 2015-04-14 DIAGNOSIS — Z87891 Personal history of nicotine dependence: Secondary | ICD-10-CM | POA: Insufficient documentation

## 2015-04-14 DIAGNOSIS — M79672 Pain in left foot: Secondary | ICD-10-CM | POA: Diagnosis present

## 2015-04-14 DIAGNOSIS — M10072 Idiopathic gout, left ankle and foot: Secondary | ICD-10-CM | POA: Diagnosis not present

## 2015-04-14 DIAGNOSIS — Z79899 Other long term (current) drug therapy: Secondary | ICD-10-CM | POA: Insufficient documentation

## 2015-04-14 DIAGNOSIS — E039 Hypothyroidism, unspecified: Secondary | ICD-10-CM | POA: Insufficient documentation

## 2015-04-14 DIAGNOSIS — I10 Essential (primary) hypertension: Secondary | ICD-10-CM | POA: Insufficient documentation

## 2015-04-14 LAB — CBC WITH DIFFERENTIAL/PLATELET
BASOS ABS: 0 10*3/uL (ref 0.0–0.1)
BASOS PCT: 1 %
EOS ABS: 0.1 10*3/uL (ref 0.0–0.7)
EOS PCT: 2 %
HCT: 37.8 % (ref 36.0–46.0)
Hemoglobin: 12.1 g/dL (ref 12.0–15.0)
LYMPHS PCT: 17 %
Lymphs Abs: 1.3 10*3/uL (ref 0.7–4.0)
MCH: 32 pg (ref 26.0–34.0)
MCHC: 32 g/dL (ref 30.0–36.0)
MCV: 100 fL (ref 78.0–100.0)
MONO ABS: 0.9 10*3/uL (ref 0.1–1.0)
Monocytes Relative: 12 %
Neutro Abs: 5.4 10*3/uL (ref 1.7–7.7)
Neutrophils Relative %: 68 %
PLATELETS: 271 10*3/uL (ref 150–400)
RBC: 3.78 MIL/uL — AB (ref 3.87–5.11)
RDW: 13.7 % (ref 11.5–15.5)
WBC: 7.8 10*3/uL (ref 4.0–10.5)

## 2015-04-14 LAB — BASIC METABOLIC PANEL
ANION GAP: 11 (ref 5–15)
BUN: 17 mg/dL (ref 6–20)
CALCIUM: 9.2 mg/dL (ref 8.9–10.3)
CO2: 27 mmol/L (ref 22–32)
Chloride: 100 mmol/L — ABNORMAL LOW (ref 101–111)
Creatinine, Ser: 0.75 mg/dL (ref 0.44–1.00)
Glucose, Bld: 117 mg/dL — ABNORMAL HIGH (ref 65–99)
POTASSIUM: 3.3 mmol/L — AB (ref 3.5–5.1)
SODIUM: 138 mmol/L (ref 135–145)

## 2015-04-14 MED ORDER — PREDNISONE 20 MG PO TABS: ORAL_TABLET | ORAL | Status: DC

## 2015-04-14 MED ORDER — PREDNISONE 20 MG PO TABS
60.0000 mg | ORAL_TABLET | Freq: Once | ORAL | Status: AC
  Administered 2015-04-14: 60 mg via ORAL
  Filled 2015-04-14: qty 3

## 2015-04-14 NOTE — ED Notes (Signed)
PT DISCHARGED. INSTRUCTIONS AND PRESCRIPTION GIVEN. AAOX3. PT IN NO APPARENT DISTRESS. THE OPPORTUNITY TO ASK QUESTIONS WAS PROVIDED. 

## 2015-04-14 NOTE — ED Provider Notes (Signed)
Medical screening examination/treatment/procedure(s) were conducted as a shared visit with non-physician practitioner(s) and myself.  I personally evaluated the patient during the encounter.  Pt complains of left foot pain.  No trauma.  Pt has had gout but not in that area before.  No fevers or chills.  No calf pain.  On exam, ttp with erythema and edema of the midfoot.  No calf ttp.  No lymphangitic streaking.  Possible gout, less likely a cellulitis.  Will check baseline CBC.  Close follow up with PCP in a couple of days to make sure it is improving.  Elaine DibblesJon Taquita Demby, MD 04/15/15 [phone]

## 2015-04-14 NOTE — ED Provider Notes (Signed)
CSN: [id number]     Arrival date & time 04/14/15  1108 History   First MD Initiated Contact with Patient 04/14/15 1504     Chief Complaint  Patient presents with  . Foot Pain     (Consider location/radiation/quality/duration/timing/severity/associated sxs/prior Treatment) HPI Comments: Elaine Mcdonald is a 82 y.o. female with a PMHx of gout, hypothyroidism, and HTN, who presents to the ED with complaints of left foot and ankle pain 4 days. She discussed pain is 8/10 constant throbbing in the ankle and dorsum of the left foot, nonradiating, worse with weightbearing and range of motion of the ankle, and mildly improved with ibuprofen and tramadol. Positive history of gout, admits to recent alcohol use. Denies any injury. Associated symptoms include swelling, erythema, and warmth to the foot and ankle. She denies any numbness, tingling, focal weakness, fevers, chills, chest pain, shortness breath, recent travel/surgery/immobilization, history of DVT/PE, abdominal pain, nausea, vomiting, diarrhea, constipation, dysuria, or hematuria. No skin injuries. She has a walker at home that she is able to to ambulate.  Patient is a 82 y.o. female presenting with lower extremity pain. The history is provided by the patient. No language interpreter was used.  Foot Pain This is a new problem. The current episode started in the past 7 days. The problem occurs constantly. The problem has been unchanged. Associated symptoms include arthralgias and joint swelling. Pertinent negatives include no abdominal pain, chest pain, chills, fever, myalgias, nausea, numbness, urinary symptoms, vomiting or weakness. The symptoms are aggravated by walking and standing. She has tried oral narcotics and NSAIDs for the symptoms. The treatment provided mild relief.    Past Medical History  Diagnosis Date  . Thyroid disease   . Anxiety   . Hypertension    Past Surgical History  Procedure Laterality Date  . Abdominal  hysterectomy    . Tubal ligation    . Hip fusion     History reviewed. No pertinent family history. Social History  Substance Use Topics  . Smoking status: Former Smoker    Types: Cigarettes    Quit date: 10/26/1979  . Smokeless tobacco: None  . Alcohol Use: Yes   OB History    No data available     Review of Systems  Constitutional: Negative for fever and chills.  Respiratory: Negative for shortness of breath.   Cardiovascular: Negative for chest pain.  Gastrointestinal: Negative for nausea, vomiting, abdominal pain, diarrhea and constipation.  Genitourinary: Negative for dysuria and hematuria.  Musculoskeletal: Positive for joint swelling and arthralgias. Negative for myalgias.  Skin: Positive for color change. Negative for wound.  Allergic/Immunologic: Negative for immunocompromised state.  Neurological: Negative for weakness and numbness.  Psychiatric/Behavioral: Negative for confusion.   10 Systems reviewed and are negative for acute change except as noted in the HPI.    Allergies  Percocet  Home Medications   Prior to Admission medications   Medication Sig Start Date End Date Taking? Authorizing Provider  amoxicillin-clavulanate (AUGMENTIN) 875-125 MG per tablet  02/10/14   Historical Provider, MD  diazepam (VALIUM) 5 MG tablet  03/19/14   Historical Provider, MD  escitalopram (LEXAPRO) 20 MG tablet Take 20 mg by mouth daily.    Historical Provider, MD  esomeprazole (NEXIUM) 40 MG capsule Take 40 mg by mouth daily at 12 noon.    Historical Provider, MD  FLUZONE HIGH-DOSE 0.5 ML SUSY  02/05/14   Historical Provider, MD  gabapentin (NEURONTIN) 300 MG capsule Take 300 mg by mouth 3 (three) times daily.  Historical Provider, MD  levothyroxine (SYNTHROID, LEVOTHROID) 100 MCG tablet Take 100 mcg by mouth daily before breakfast.    Historical Provider, MD  meloxicam (MOBIC) 15 MG tablet  02/05/14   Historical Provider, MD  Multiple Vitamin (MULTIVITAMIN) tablet Take 1  tablet by mouth daily.    Historical Provider, MD  NASONEX 50 MCG/ACT nasal spray  02/05/14   Historical Provider, MD  nitrofurantoin, macrocrystal-monohydrate, (MACROBID) 100 MG capsule  02/05/14   Historical Provider, MD  phenazopyridine (PYRIDIUM) 100 MG tablet  02/05/14   Historical Provider, MD  simvastatin (ZOCOR) 20 MG tablet Take 20 mg by mouth daily.    Historical Provider, MD  valsartan-hydrochlorothiazide (DIOVAN-HCT) 160-25 MG per tablet Take 1 tablet by mouth daily.    Historical Provider, MD  VESICARE 5 MG tablet  01/19/14   Historical Provider, MD  zolpidem (AMBIEN) 10 MG tablet  04/17/14   Historical Provider, MD   BP 148/64 mmHg  Pulse 66  Temp(Src) 97.5 F (36.4 C) (Oral)  Resp 16  SpO2 98% Physical Exam  Constitutional: She is oriented to person, place, and time. Vital signs are normal. She appears well-developed and well-nourished.  Non-toxic appearance. No distress.  Afebrile, nontoxic, NAD  HENT:  Head: Normocephalic and atraumatic.  Mouth/Throat: Oropharynx is clear and moist and mucous membranes are normal.  Eyes: Conjunctivae and EOM are normal. Right eye exhibits no discharge. Left eye exhibits no discharge.  Neck: Normal range of motion. Neck supple.  Cardiovascular: Normal rate, regular rhythm, normal heart sounds and intact distal pulses.  Exam reveals no gallop and no friction rub.   No murmur heard. Pulmonary/Chest: Effort normal and breath sounds normal. No respiratory distress. She has no decreased breath sounds. She has no wheezes. She has no rhonchi. She has no rales.  Abdominal: Soft. Normal appearance and bowel sounds are normal. She exhibits no distension. There is no tenderness. There is no rigidity, no rebound and no guarding.  Musculoskeletal:       Left ankle: She exhibits decreased range of motion (due to pain) and swelling. She exhibits no deformity, no laceration and normal pulse. Tenderness. Lateral malleolus and medial malleolus tenderness found.  Achilles tendon normal.       Left foot: There is tenderness and swelling. There is normal range of motion, normal capillary refill, no crepitus and no deformity.       Feet:  L ankle and forefoot with diffuse TTP, mildly diminished ROM of ankle due to pain, with mild erythema and warmth but not cellulitic in appearance. Mild swelling noted to ankle/forefoot, no swelling to calf. Neg Homan's. Strength and sensation grossly intact, distal pulses intact, wiggles toes without difficulty. No skin injuries.  Neurological: She is alert and oriented to person, place, and time. She has normal strength. No sensory deficit.  Skin: Skin is warm, dry and intact. No rash noted.  Psychiatric: She has a normal mood and affect.  Nursing note and vitals reviewed.   ED Course  Procedures (including critical care time) Labs Review Labs Reviewed  CBC WITH DIFFERENTIAL/PLATELET - Abnormal; Notable for the following:    RBC 3.78 (*)    All other components within normal limits  BASIC METABOLIC PANEL - Abnormal; Notable for the following:    Potassium 3.3 (*)    Chloride 100 (*)    Glucose, Bld 117 (*)    All other components within normal limits    Imaging Review Dg Foot Complete Left  04/14/2015  CLINICAL DATA:  Left  foot pain with erythema and swelling for 4 days. No known injury. History of gout. EXAM: LEFT FOOT - COMPLETE 3+ VIEW COMPARISON:  None available. FINDINGS: The mineralization and alignment are normal. There is no evidence of acute fracture or dislocation. There are mild midfoot degenerative changes. No erosive changes are identified. There is dorsal soft tissue swelling in the forefoot, best seen on the lateral view. No evidence of foreign body or bone destruction. IMPRESSION: Nonspecific dorsal forefoot soft tissue swelling. No acute osseous findings or evidence of osteomyelitis. Electronically Signed   By: Carey Bullocks M.D.   On: 04/14/2015 11:57   I have personally reviewed and evaluated  these images and lab results as part of my medical decision-making.   EKG Interpretation None      MDM   Final diagnoses:  Acute gout of left ankle, unspecified cause  Left foot pain    82 y.o. female here with L foot/ankle pain and swelling/erythema/warmth x4 days. Hx of gout. +Alcohol use recently. On exam, L foot and ankle with TTP diffusely throughout ankle and dorsum of foot, warmth and erythema, mild swelling to foot but no calf swelling, neg homan's. Does not appear cellulitic, nor does it appear to be due to a blood clot since it's more localized to the ankle and foot. NVI with soft compartments. Denies injury, xray obtained in triage which was negative aside from soft tissue swelling. No recent lab work in our system, therefore don't know her kidney function but will avoid starting colchicine since her kidney function is unknown. Will use prednisone (pt has used before). Will give post op shoe. Pt has walker at home and has help at home to ambulate. F/up with PCP in 3-4 days. RICE discussed. Awaiting to discuss case with Dr. Lynelle Doctor my attending.  4:29 PM Discussed case with attending Dr. Lynelle Doctor, who would like to get basic labs to ensure no leukocytosis, and check kidney function. Will reassess after labs.  5:43 PM Labs unremarkable, no leukocytosis and kidney function preserved. Previously discussed plan remains. I explained the diagnosis and have given explicit precautions to return to the ER including for any other new or worsening symptoms. The patient understands and accepts the medical plan as it's been dictated and I have answered their questions. Discharge instructions concerning home care and prescriptions have been given. The patient is STABLE and is discharged to home in good condition.   BP 174/55 mmHg  Pulse 78  Temp(Src) 97.5 F (36.4 C) (Oral)  Resp 18  SpO2 98%  Meds ordered this encounter  Medications  . predniSONE (DELTASONE) tablet 60 mg    Sig:   .  predniSONE (DELTASONE) 20 MG tablet    Sig: 3 tabs po daily x 4 days STARTING ON 04/15/15    Dispense:  12 tablet    Refill:  0    Order Specific Question:  Supervising Provider    Answer:  Eber Hong [[id number]]     Leba Tibbitts Camprubi-Soms, PA-C 04/14/15 1744  Linwood Dibbles, MD 04/15/15 1505

## 2015-04-14 NOTE — Discharge Instructions (Signed)
Use post op shoe as needed for comfort. Use home walker as needed to help with walking while your ankle/foot improve. Take prednisone as directed, starting tomorrow since you got a dose today. Alternate between ibuprofen and tylenol as needed for pain. Use home tramadol as needed for pain. Ice and elevate foot throughout the day. Follow up with your regular doctor in 3-4 days for recheck of symptoms. Return to the ER for changes or worsening symptoms.    Gout Gout is when your joints become red, sore, and swell (inflamed). This is caused by the buildup of uric acid crystals in the joints. Uric acid is a chemical that is normally in the blood. If the level of uric acid gets too high in the blood, these crystals form in your joints and tissues. Over time, these crystals can form into masses near the joints and tissues. These masses can destroy bone and cause the bone to look misshapen (deformed). HOME CARE   Do not take aspirin for pain.  Only take medicine as told by your doctor.  Rest the joint as much as you can. When in bed, keep sheets and blankets off painful areas.  Keep the sore joints raised (elevated).  Put warm or cold packs on painful joints. Use of warm or cold packs depends on which works best for you.  Use crutches if the painful joint is in your leg.  Drink enough fluids to keep your pee (urine) clear or pale yellow. Limit alcohol, sugary drinks, and drinks with fructose in them.  Follow your diet instructions. Pay careful attention to how much protein you eat. Include fruits, vegetables, whole grains, and fat-free or low-fat milk products in your daily diet. Talk to your doctor or dietitian about the use of coffee, vitamin C, and cherries. These may help lower uric acid levels.  Keep a healthy body weight. GET HELP RIGHT AWAY IF:   You have watery poop (diarrhea), throw up (vomit), or have any side effects from medicines.  You do not feel better in 24 hours, or you are  getting worse.  Your joint becomes suddenly more tender, and you have chills or a fever. MAKE SURE YOU:   Understand these instructions.  Will watch your condition.  Will get help right away if you are not doing well or get worse.   This information is not intended to replace advice given to you by your health care provider. Make sure you discuss any questions you have with your health care provider.   Document Released: 12/29/2007 Document Revised: 04/11/2014 Document Reviewed: 11/02/2011 Elsevier Interactive Patient Education 2016 Elsevier Inc.  Cryotherapy Cryotherapy is when you put ice on your injury. Ice helps lessen pain and puffiness (swelling) after an injury. Ice works the best when you start using it in the first 24 to 48 hours after an injury. HOME CARE  Put a dry or damp towel between the ice pack and your skin.  You may press gently on the ice pack.  Leave the ice on for no more than 10 to 20 minutes at a time.  Check your skin after 5 minutes to make sure your skin is okay.  Rest at least 20 minutes between ice pack uses.  Stop using ice when your skin loses feeling (numbness).  Do not use ice on someone who cannot tell you when it hurts. This includes small children and people with memory problems (dementia). GET HELP RIGHT AWAY IF:  You have white spots on your  skin.  Your skin turns blue or pale.  Your skin feels waxy or hard.  Your puffiness gets worse. MAKE SURE YOU:   Understand these instructions.  Will watch your condition.  Will get help right away if you are not doing well or get worse.   This information is not intended to replace advice given to you by your health care provider. Make sure you discuss any questions you have with your health care provider.   Document Released: 09/07/2007 Document Revised: 06/13/2011 Document Reviewed: 11/11/2010 Elsevier Interactive Patient Education 2016 Elsevier Inc.  Low-Purine Diet Purines are  compounds that affect the level of uric acid in your body. A low-purine diet is a diet that is low in purines. Eating a low-purine diet can prevent the level of uric acid in your body from getting too high and causing gout or kidney stones or both. WHAT DO I NEED TO KNOW ABOUT THIS DIET?  Choose low-purine foods. Examples of low-purine foods are listed in the next section.  Drink plenty of fluids, especially water. Fluids can help remove uric acid from your body. Try to drink 8-16 cups (1.9-3.8 L) a day.  Limit foods high in fat, especially saturated fat, as fat makes it harder for the body to get rid of uric acid. Foods high in saturated fat include pizza, cheese, ice cream, whole milk, fried foods, and gravies. Choose foods that are lower in fat and lean sources of protein. Use olive oil when cooking as it contains healthy fats that are not high in saturated fat.  Limit alcohol. Alcohol interferes with the elimination of uric acid from your body. If you are having a gout attack, avoid all alcohol.  Keep in mind that different people's bodies react differently to different foods. You will probably learn over time which foods do or do not affect you. If you discover that a food tends to cause your gout to flare up, avoid eating that food. You can more freely enjoy foods that do not cause problems. If you have any questions about a food item, talk to your dietitian or health care provider. WHICH FOODS ARE LOW, MODERATE, AND HIGH IN PURINES? The following is a list of foods that are low, moderate, and high in purines. You can eat any amount of the foods that are low in purines. You may be able to have small amounts of foods that are moderate in purines. Ask your health care provider how much of a food moderate in purines you can have. Avoid foods high in purines. Grains  Foods low in purines: Enriched white bread, pasta, rice, cake, cornbread, popcorn.  Foods moderate in purines: Whole-grain breads  and cereals, wheat germ, bran, oatmeal. Uncooked oatmeal. Dry wheat bran or wheat germ.  Foods high in purines: Pancakes, [place] toast, biscuits, muffins. Vegetables  Foods low in purines: All vegetables, except those that are moderate in purines.  Foods moderate in purines: Asparagus, cauliflower, spinach, mushrooms, green peas. Fruits  All fruits are low in purines. Meats and other Protein Foods  Foods low in purines: Eggs, nuts, peanut butter.  Foods moderate in purines: 80-90% lean beef, lamb, veal, pork, poultry, fish, eggs, peanut butter, nuts. Crab, lobster, oysters, and shrimp. Cooked dried beans, peas, and lentils.  Foods high in purines: Anchovies, sardines, herring, mussels, tuna, codfish, scallops, trout, and haddock. Elaine Mcdonald. Organ meats (such as liver or kidney). Tripe. Game meat. Goose. Sweetbreads. Dairy  All dairy foods are low in purines. Low-fat and fat-free dairy  products are best because they are low in saturated fat. Beverages  Drinks low in purines: Water, carbonated beverages, tea, coffee, cocoa.  Drinks moderate in purines: Soft drinks and other drinks sweetened with high-fructose corn syrup. Juices. To find whether a food or drink is sweetened with high-fructose corn syrup, look at the ingredients list.  Drinks high in purines: Alcoholic beverages (such as beer). Condiments  Foods low in purines: Salt, herbs, olives, pickles, relishes, vinegar.  Foods moderate in purines: Butter, margarine, oils, mayonnaise. Fats and Oils  Foods low in purines: All types, except gravies and sauces made with meat.  Foods high in purines: Gravies and sauces made with meat. Other Foods  Foods low in purines: Sugars, sweets, gelatin. Cake. Soups made without meat.  Foods moderate in purines: Meat-based or fish-based soups, broths, or bouillons. Foods and drinks sweetened with high-fructose corn syrup.  Foods high in purines: High-fat desserts (such as ice cream,  cookies, cakes, pies, doughnuts, and chocolate). Contact your dietitian for more information on foods that are not listed here.   This information is not intended to replace advice given to you by your health care provider. Make sure you discuss any questions you have with your health care provider.   Document Released: 07/16/2010 Document Revised: 03/26/2013 Document Reviewed: 02/25/2013 Elsevier Interactive Patient Education [employer]2016 Elsevier Inc.

## 2015-04-14 NOTE — ED Notes (Addendum)
Per ems pt is from home, c/o left foot pain x4 days. Redness and swelling to foot. Denies injury or trauma. Hx of gout, has not had a flare up in several years. C/o top of foot hurts. Pt had appointment with pcp at 1130, but at 1000 pt canceled appt and called ems. Pt tried to call back to get appt but the spot had been filled. Pt has appt with pcp tomorrow at 0800.  Upon rn assessment pts left foot red and swollen. Pain 7/10. Will not bear weight on foot.

## 2016-08-08 ENCOUNTER — Encounter: Payer: Self-pay | Admitting: Neurology

## 2016-08-08 ENCOUNTER — Ambulatory Visit (INDEPENDENT_AMBULATORY_CARE_PROVIDER_SITE_OTHER): Payer: Medicare Other | Admitting: Neurology

## 2016-08-08 VITALS — BP 149/67 | HR 63 | Ht 65.0 in

## 2016-08-08 DIAGNOSIS — E538 Deficiency of other specified B group vitamins: Secondary | ICD-10-CM

## 2016-08-08 DIAGNOSIS — R413 Other amnesia: Secondary | ICD-10-CM | POA: Diagnosis not present

## 2016-08-08 HISTORY — DX: Other amnesia: R41.3

## 2016-08-08 MED ORDER — DONEPEZIL HCL 5 MG PO TABS: 5.0000 mg | ORAL_TABLET | Freq: Every day | ORAL | 1 refills | Status: DC

## 2016-08-08 NOTE — Patient Instructions (Signed)
   We will check CT of the head and some blood work, We will start Aricept for memory.  Begin Aricept (donepezil) at 5 mg at night for one month. If this medication is well-tolerated, please call our office and we will call in a prescription for the 10 mg tablets. Look out for side effects that may include nausea, diarrhea, weight loss, or stomach cramps. This medication will also cause a runny nose, therefore there is no need for allergy medications for this purpose.

## 2016-08-08 NOTE — Progress Notes (Signed)
Reason for visit: Memory disturbance  Referring physician: Dr. Philipp OvensKaplan  Elaine Mcdonald is a 83 y.o. female  History of present illness:  Ms. Elaine BalzarineCarmichael is an 83 year old right-handed white female with a history of a memory disturbance that has been noticed for over the last year or 2. The patient lives at home with her husband. She has been under some stress with the medical health of her husband. They have been limited in their ability to get out of the house and interact with other people. The patient has had some problems with remembering names for people but she indicates that this has been a lifelong problem. She is having increasing problems with remembering recent conversations or events. She does operate a motor vehicle without difficulty. She does not manage the finances, the children have been doing this for almost 10 years. The patient has had difficulty keeping up with medications, she stopped most of her medications recently, she has just now started taking them again. The patient does keep up with appointments. The patient has not had any overt depression. She sometimes does sleep well. She denies any significant problems with energy levels during the day. The patient denies any focal numbness or weakness of the face, arms, or legs. She does have a mild balance issue, she may use a cane on occasion. She is sent to this office for further evaluation. Her paternal grandparents also had some memory issues.    Past Medical History:  Diagnosis Date  . Anxiety   . Hypertension   . Thyroid disease     Past Surgical History:  Procedure Laterality Date  . ABDOMINAL HYSTERECTOMY    . HIP FUSION    . TUBAL LIGATION      History reviewed. No pertinent family history.  Social history:  reports that she quit smoking about 36 years ago. Her smoking use included Cigarettes. She does not have any smokeless tobacco history on file. She reports that she drinks alcohol. She reports that  she does not use drugs.  Medications:  Prior to Admission medications   Medication Sig Start Date End Date Taking? Authorizing Provider  buPROPion (WELLBUTRIN XL) 300 MG 24 hr tablet Take 300 mg by mouth daily.  05/31/16  Yes [provider]  nitrofurantoin, macrocrystal-monohydrate, (MACROBID) 100 MG capsule Take 100 mg by mouth.  07/26/16  Yes [provider]  Vitamin D, Ergocalciferol, (DRISDOL) 50000 units CAPS capsule Take 50,000 Units by mouth every 7 (seven) days.  07/26/16  Yes [provider]      Allergies  Allergen Reactions  . Codeine Nausea And Vomiting  . Percocet [Oxycodone-Acetaminophen] Hives    ROS:  Out of a complete 14 system review of symptoms, the patient complains only of the following symptoms, and all other reviewed systems are negative.  Memory loss Depression  Blood pressure (!) 149/67, pulse 63, height 5\' 5"  (1.651 m).  Physical Exam  General: The patient is alert and cooperative at the time of the examination. The patient is moderately obese.  Eyes: Pupils are equal, round, and reactive to light. Discs are flat bilaterally.  Neck: The neck is supple, no carotid bruits are noted.  Respiratory: The respiratory examination is clear.  Cardiovascular: The cardiovascular examination reveals a regular rate and rhythm, no obvious murmurs or rubs are noted.  Skin: Extremities are without significant edema.  Neurologic Exam  Mental status: The patient is alert and oriented x 3 at the time of the examination. The patient  has apparent normal recent and remote memory, with an apparently normal attention span and concentration ability. The Mini-Mental Status Examination done today shows a total score 28/30.  Cranial nerves: Facial symmetry is present. There is good sensation of the face to pinprick and soft touch bilaterally. The strength of the facial muscles and the muscles to head turning and shoulder shrug are normal bilaterally.  Speech is well enunciated, no aphasia or dysarthria is noted. Extraocular movements are full. Visual fields are full. The tongue is midline, and the patient has symmetric elevation of the soft palate. No obvious hearing deficits are noted.  Motor: The motor testing reveals 5 over 5 strength of all 4 extremities. Good symmetric motor tone is noted throughout.  Sensory: Sensory testing is intact to pinprick, soft touch, vibration sensation, and position sense on all 4 extremities. No evidence of extinction is noted.  Coordination: Cerebellar testing reveals good finger-nose-finger and heel-to-shin bilaterally.  Gait and station: Gait is normal. Tandem gait is unsteady. Romberg is negative. No drift is seen.  Reflexes: Deep tendon reflexes are symmetric and normal bilaterally. Toes are downgoing bilaterally.   Assessment/Plan:  1. Memory disturbance  The patient has a mild memory disturbance has been present for the last year or 2. The patient will be set up for blood work today, she will have a CT scan of the brain. He will be placed on low-dose Aricept. She will call in one month to increase the dose to a maintenance dose of 10 mg. The patient will follow-up in 6 months.   Marlan Palau MD 08/08/2016 2:15 PM  [facility] [address] [place], [place] [address]  Phone [phone] Fax [phone]

## 2016-08-09 LAB — SEDIMENTATION RATE: Sed Rate: 12 mm/hr (ref 0–40)

## 2016-08-09 LAB — VITAMIN B12: Vitamin B-12: 281 pg/mL (ref 232–1245)

## 2016-08-09 LAB — RPR: RPR: NONREACTIVE

## 2016-08-12 ENCOUNTER — Other Ambulatory Visit: Payer: Medicare Other

## 2016-08-18 ENCOUNTER — Ambulatory Visit
Admission: RE | Admit: 2016-08-18 | Discharge: 2016-08-18 | Disposition: A | Discharge status: Home / Self Care | Payer: Medicare Other | Source: Ambulatory Visit | Attending: Neurology | Admitting: Neurology

## 2016-08-18 DIAGNOSIS — R413 Other amnesia: Secondary | ICD-10-CM

## 2016-08-19 ENCOUNTER — Telehealth: Payer: Self-pay | Admitting: Neurology

## 2016-08-19 NOTE — Telephone Encounter (Signed)
I called the patient. The CT shows a moderate level of SVD, she is to go on aspirin 325 mg daily.  CT head 08/19/16:  IMPRESSION:  This CT scan of the head without contrast shows the following: 1.    Mild generalized cortical atrophy. 2.    Hypodense changes in the periventricular and deep white matter of the hemispheres consistent with moderate chronic microvascular ischemic change. 3.    There are no acute findings.

## 2016-09-12 ENCOUNTER — Ambulatory Visit
Admission: RE | Admit: 2016-09-12 | Discharge: 2016-09-12 | Disposition: A | Discharge status: Home / Self Care | Payer: Medicare Other | Source: Ambulatory Visit | Attending: Physician Assistant | Admitting: Physician Assistant

## 2016-09-12 ENCOUNTER — Other Ambulatory Visit: Payer: Self-pay | Admitting: Physician Assistant

## 2016-09-12 DIAGNOSIS — R109 Unspecified abdominal pain: Secondary | ICD-10-CM

## 2017-02-09 ENCOUNTER — Encounter: Payer: Self-pay | Admitting: Neurology

## 2017-02-09 ENCOUNTER — Ambulatory Visit (INDEPENDENT_AMBULATORY_CARE_PROVIDER_SITE_OTHER): Payer: Medicare Other | Admitting: Neurology

## 2017-02-09 VITALS — BP 178/70 | HR 72 | Wt 199.5 lb

## 2017-02-09 DIAGNOSIS — R413 Other amnesia: Secondary | ICD-10-CM | POA: Diagnosis not present

## 2017-02-09 MED ORDER — DONEPEZIL HCL 10 MG PO TABS: 10.0000 mg | ORAL_TABLET | Freq: Every day | ORAL | 3 refills | Status: AC

## 2017-02-09 NOTE — Patient Instructions (Signed)
   We will increase the Aricept to 10 mg a day.  Begin Aricept (donepezil) at 5 mg at night for one month. If this medication is well-tolerated, please call our office and we will call in a prescription for the 10 mg tablets. Look out for side effects that may include nausea, diarrhea, weight loss, or stomach cramps. This medication will also cause a runny nose, therefore there is no need for allergy medications for this purpose.

## 2017-02-09 NOTE — Progress Notes (Signed)
Reason for visit: Memory disturbance  Elaine RosenthalSally C Mcdonald is an 83 y.o. female  History of present illness:  Ms. Elaine BalzarineCarmichael is an 83 year old right-handed white female with a history of a progressive memory disturbance.  The patient has had CT scan evaluation of the brain that showed a moderate level of small vessel disease, the patient was asked to go on low-dose aspirin.  The patient is on Aricept 5 mg dose, she seems to be tolerating this well.  The patient does operate a motor vehicle, she is doing well with this.  She takes care of her husband who has a severe neuropathy.  There is some underlying history of depression, but the patient is on Wellbutrin and she claims that she is not severely depressed at this time.  The patient returns to this office for further evaluation.  She has not given up any activities of daily living because of memory.  Past Medical History:  Diagnosis Date  . Anxiety   . Hypertension   . Memory disorder 08/08/2016  . Thyroid disease     Past Surgical History:  Procedure Laterality Date  . ABDOMINAL HYSTERECTOMY    . HIP FUSION    . TUBAL LIGATION      History reviewed. No pertinent family history.  Social history:  reports that she quit smoking about 37 years ago. Her smoking use included cigarettes. she has never used smokeless tobacco. She reports that she drinks alcohol. She reports that she does not use drugs.    Allergies  Allergen Reactions  . Codeine Nausea And Vomiting  . Percocet [Oxycodone-Acetaminophen] Hives    Medications:  Prior to Admission medications   Medication Sig Start Date End Date Taking? Authorizing Provider  buPROPion (WELLBUTRIN XL) 300 MG 24 hr tablet Take 300 mg by mouth daily.  05/31/16  Yes [provider]  donepezil (ARICEPT) 5 MG tablet Take 1 tablet (5 mg total) by mouth at bedtime. 08/08/16  Yes York SpanielWillis, Charles K, MD  Vitamin D, Ergocalciferol, (DRISDOL) 50000 units CAPS capsule Take 50,000 Units by  mouth every 7 (seven) days.  07/26/16  Yes [provider]    ROS:  Out of a complete 14 system review of symptoms, the patient complains only of the following symptoms, and all other reviewed systems are negative.  Shortness of breath Incontinence of the bladder Walking difficulty Memory loss Depression, anxiety  Blood pressure (!) 178/70, pulse 72, weight 199 lb 8 oz (90.5 kg).  Physical Exam  General: The patient is alert and cooperative at the time of the examination.  The patient is markedly obese.  Skin: No significant peripheral edema is noted.   Neurologic Exam  Mental status: The patient is alert and oriented x 3 at the time of the examination. The patient has apparent normal recent and remote memory, with an apparently normal attention span and concentration ability.  Mini-Mental status examination done today shows a total score 29/30.   Cranial nerves: Facial symmetry is present. Speech is normal, no aphasia or dysarthria is noted. Extraocular movements are full. Visual fields are full.  Motor: The patient has good strength in all 4 extremities.  Sensory examination: Soft touch sensation is symmetric on the face, arms, and legs.  Coordination: The patient has good finger-nose-finger and heel-to-shin bilaterally.  Gait and station: The patient has a normal gait. Tandem gait is slightly unsteady. Romberg is negative. No drift is seen.  Reflexes: Deep tendon reflexes are symmetric, but are depressed.  CT head 08/19/16:  IMPRESSION: This CT scan of the head without contrast shows the following: 1. Mild generalized cortical atrophy. 2. Hypodense changes in the periventricular and deep white matter of the hemispheres consistent with moderate chronic microvascular ischemic change. 3. There are no acute findings.  * CT scan images were reviewed online. I agree with the written report.    Assessment/Plan:  1.  Progressive memory  disturbance  2.  Small vessel disease by CT brain  The patient will be increased on the Aricept taking 10 mg at night.  This prescription was sent in.  She will follow-up in 6 months, she will call if she is not tolerating the dose increase.  Marlan Palau. Keith Willis MD 02/09/2017 8:02 PM  [facility] [address] [place], [place] [id number]  Phone [phone] Fax [phone]

## 2017-07-27 ENCOUNTER — Ambulatory Visit: Payer: Medicare Other | Admitting: Adult Health

## 2017-08-09 ENCOUNTER — Ambulatory Visit: Payer: Medicare Other | Admitting: Adult Health

## 2017-10-14 IMAGING — CT CT ABD-PELV W/O CM
3 of 4 series · 12 of 36 positions shown, 18 images · IV contrast (WATER)
Comparison: None.

CLINICAL DATA: 85-year-old with left lower quadrant pain for 2
days. Prior hysterectomy and appendectomy.

EXAM:
CT ABDOMEN AND PELVIS WITHOUT CONTRAST
TECHNIQUE: Multidetector CT imaging of the abdomen and pelvis was performed
following the standard protocol without IV contrast.

[Series 3: abd/pelvis w/o · axial · non-contrast · 0.79mm/px · z∈[-380,-30]mm · 8 of 90 slices shown, 13 images]
[im 10/90  soft-tissue]
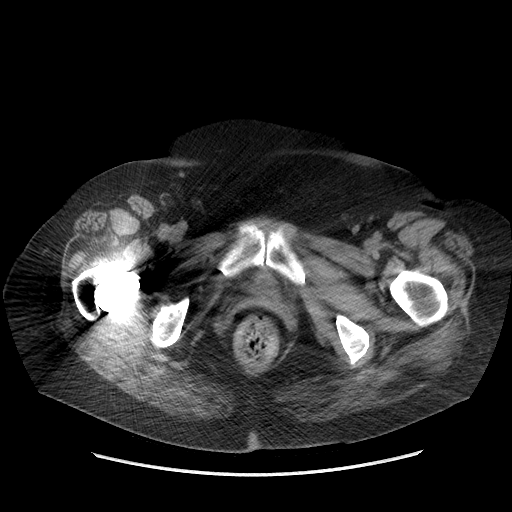
[im 10/90  bone]
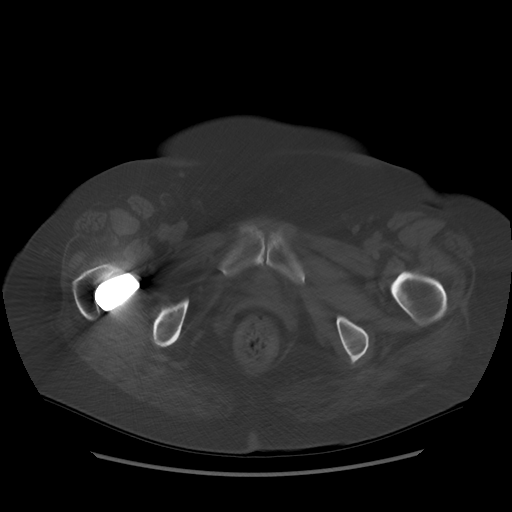
[im 20/90  soft-tissue]
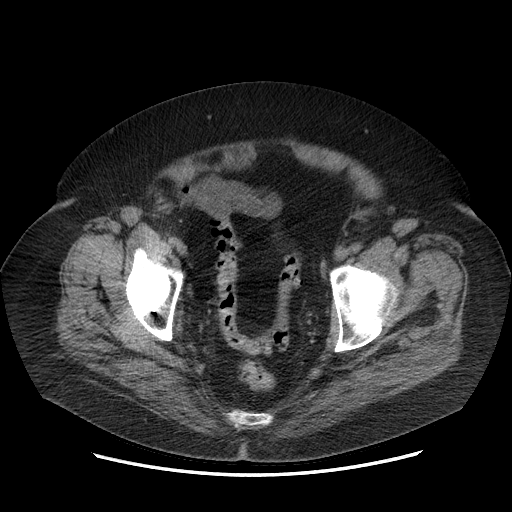
[im 30/90  soft-tissue]
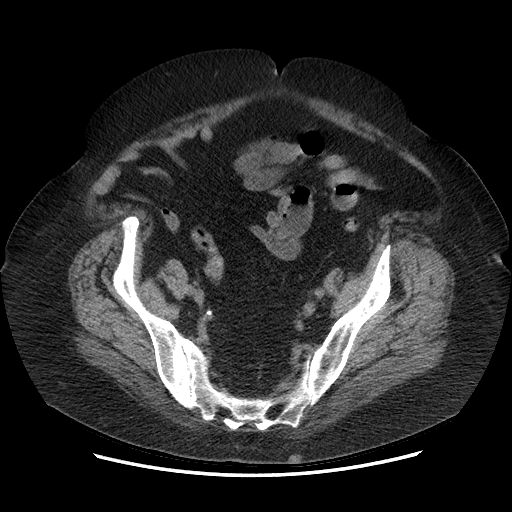
[im 40/90  soft-tissue]
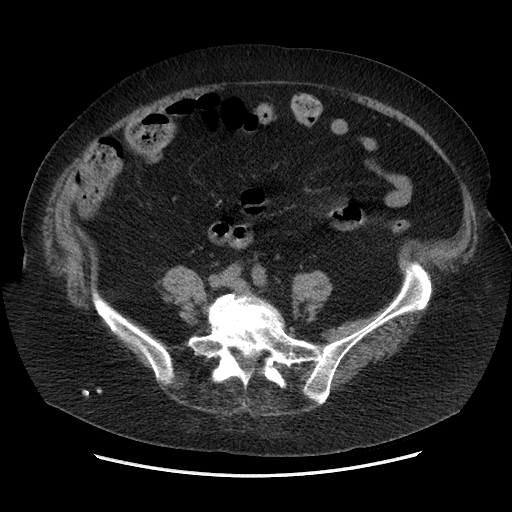
[im 50/90  soft-tissue]
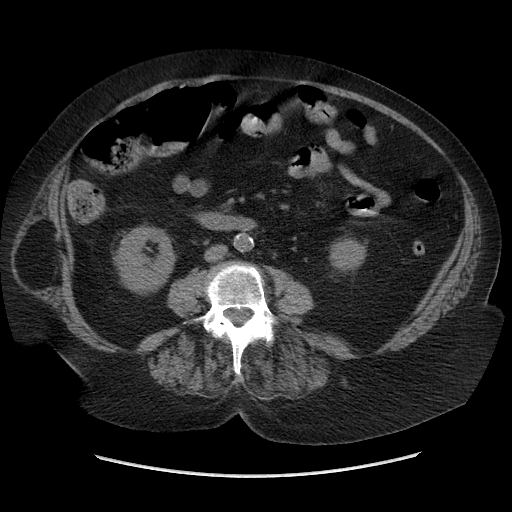
[im 50/90  lung]
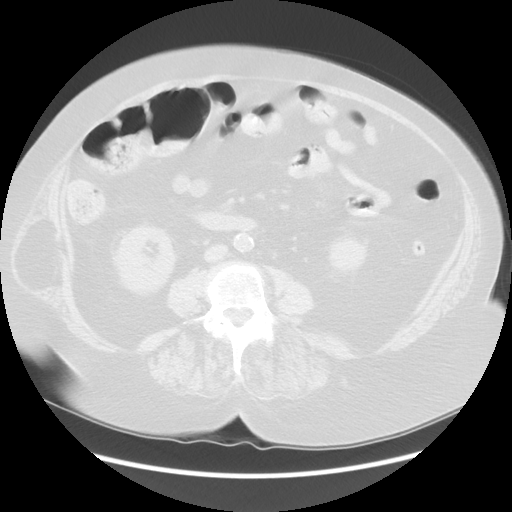
[im 60/90  soft-tissue]
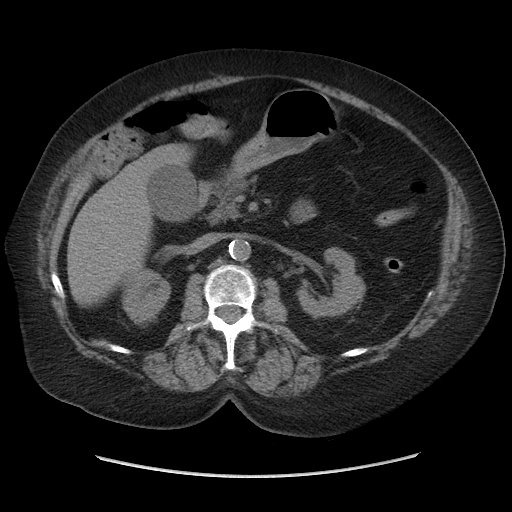
[im 60/90  lung]
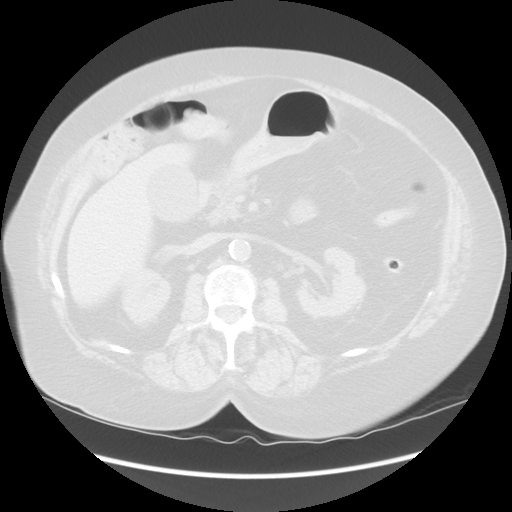
[im 70/90  soft-tissue]
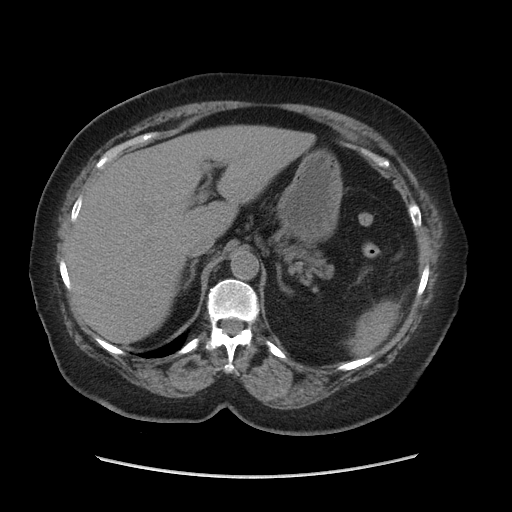
[im 70/90  lung]
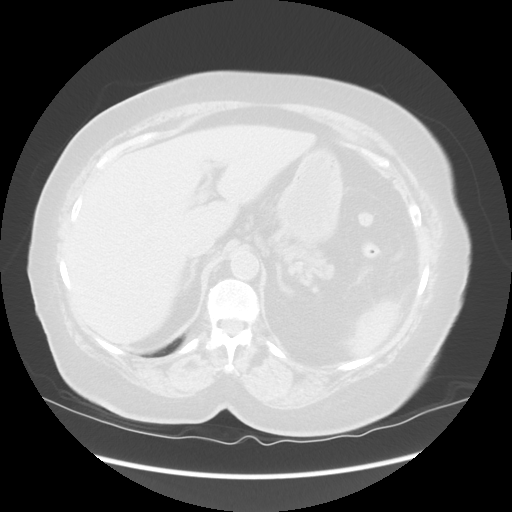
[im 80/90  soft-tissue]
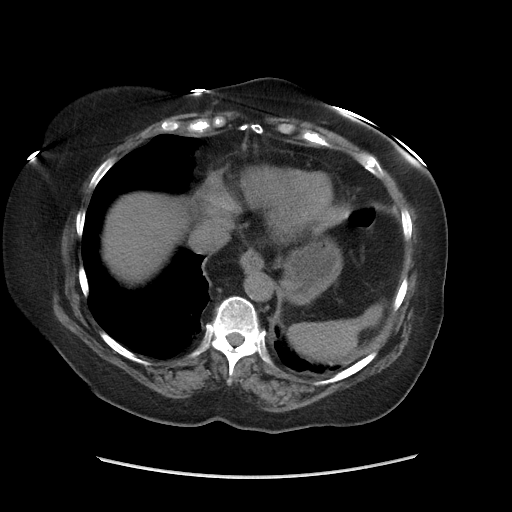
[im 80/90  lung]
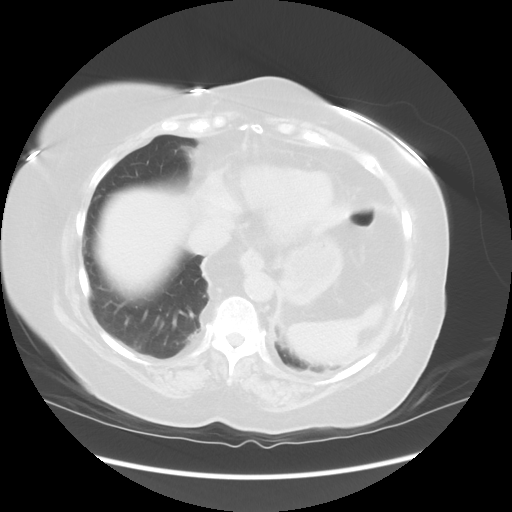

[Series 601: coronal body · coronal · 0.87mm/px · 1 of 137 slices shown, 2 images]
[im 46/137  soft-tissue]
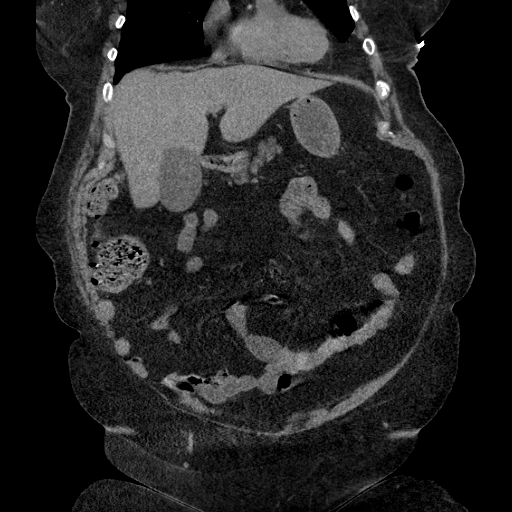
[im 46/137  bone]
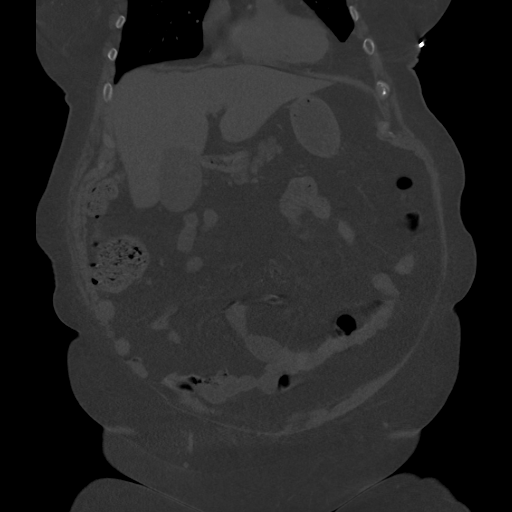

[Series 602: sagittal body · sagittal · 0.87mm/px · 3 of 163 slices shown]
[im 19/163  soft-tissue]
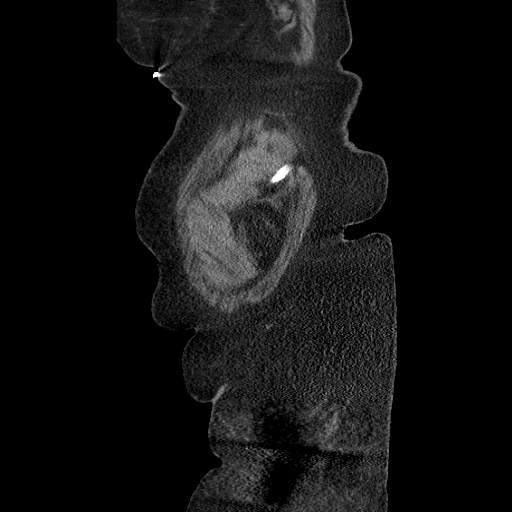
[im 37/163  soft-tissue]
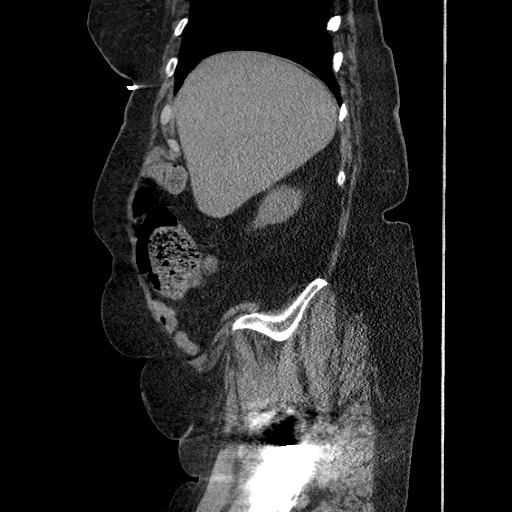
[im 55/163  soft-tissue]
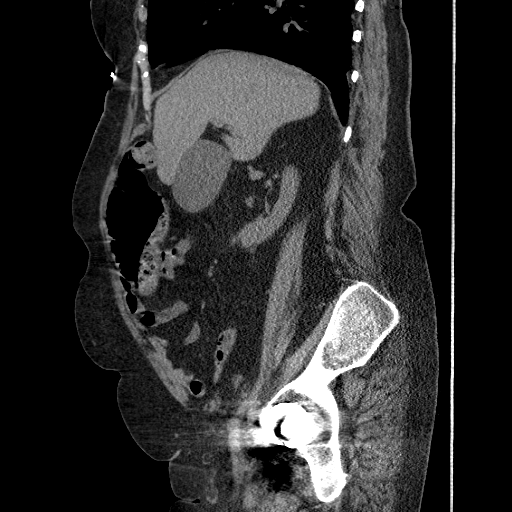

[12 of 36 positions shown; findings below may reference images not displayed]

FINDINGS: Lower chest: Minimal scarring lung bases. Heart size within normal
limits.

Hepatobiliary: Taking into account limitation by non contrast
imaging, no worrisome hepatic lesion. Gallbladder is full but
without calcified gallstone.

Pancreas: Taking into account limitation by non contrast imaging, no
mass or inflammation.

Spleen: Haziness of fat planes surround the spleen of indeterminate
etiology. This is new compared to 6881 and may reflect result of
prior inflammation or trauma. If the patient has left upper quadrant
tenderness, this could be evaluated with contrast-enhanced exam.

Adrenals/Urinary Tract: No hydronephrosis or obstructing stone.
Taking into account limitation by non contrast imaging, no renal or
adrenal mass.

Evaluation of urinary bladder limited by artifact caused by right
hip replacement. No gross abnormality noted.

Stomach/Bowel: Scattered colonic diverticula most notable sigmoid
colon however no extraluminal bowel inflammatory process.

Vascular/Lymphatic: Atherosclerotic changes aorta, iliac arteries
and splenic artery with 9 mm splenic artery aneurysm.

Reproductive: Post hysterectomy.

Other: 6 x 4 x 8 cm slightly complex fatty lesion between the right
transversalis and right external oblique muscle measured 2.6 x 1.6 x
1.5 cm on 3772 exam. Question slightly complex lipoma. Cannot
entirely exclude a very slow growing low-grade liposarcoma.

Musculoskeletal: Scoliosis. Degenerative changes most prominent
lower lumbar spine. 2 cm sclerotic focus L3 vertebral body measured
1.2 cm in 5977. Etiology/significance indeterminate. This may
represent a bone island.
IMPRESSION: Diverticulosis sigmoid colon without extraluminal bowel inflammatory
process noted.

Haziness of fat planes surround the spleen of indeterminate
etiology. This is new compared to 6881 and may reflect result of
prior inflammation or trauma. If the patient has left upper quadrant
tenderness, this could be evaluated with contrast-enhanced exam.

Aortic atherosclerosis.

9 mm splenic artery aneurysm.

6 x 4 x 8 cm slightly complex fatty lesion between the right
transversalis and right external oblique muscle measured 2.6 x 1.6 x
1.5 cm on 3772 exam. Question slightly complex lipoma. Cannot
entirely exclude a very slow growing low-grade liposarcoma.

2 cm sclerotic focus L3 vertebral body measured 1.2 cm in 5977. This
may represent a bone island.

## 2017-12-26 ENCOUNTER — Telehealth: Payer: Self-pay

## 2017-12-26 NOTE — Telephone Encounter (Signed)
Phone call placed to patient's daughter, Claris CheMargaret, to schedule initial visit with NP. Scheduled for 01/02/18

## 2018-01-02 ENCOUNTER — Other Ambulatory Visit: Payer: Medicare Other | Admitting: Nurse Practitioner

## 2018-01-02 NOTE — Progress Notes (Unsigned)
    PALLIATIVE CARE CONSULT VISIT   PATIENT NAME: Elaine Mcdonald DOB: 07/24/1930 MRN: [id number]  PRIMARY CARE PROVIDER:   Richmond Campbell., PA-C Dr. Benedetto Goad  REFERRING PROVIDER:  Richmond Campbell., PA-C [address] [place] [address]  RESPONSIBLE PARTY:     ASSESSMENT/RECOMMENDATIONS and PLAN:    -sleeps well  Chronic medical problems -            I spent *** minutes providing this consultation,  from *** to ***. More than 50% of the time in this consultation was spent coordinating communication.   HISTORY OF PRESENT ILLNESS:  Elaine Mcdonald is a 84 y.o. year old female with multiple medical problems including ***. Palliative Care was asked to help address goals of care.   CODE STATUS:   PPS: 0% HOSPICE ELIGIBILITY/DIAGNOSIS: TBD  PAST MEDICAL HISTORY:  Past Medical History:  Diagnosis Date  . Anxiety   . Hypertension   . Memory disorder 08/08/2016  . Thyroid disease     SOCIAL HX:  Social History   Tobacco Use  . Smoking status: Former Smoker    Types: Cigarettes    Last attempt to quit: 10/26/1979    Years since quitting: 38.2  . Smokeless tobacco: Never Used  Substance Use Topics  . Alcohol use: Yes    ALLERGIES:  Allergies  Allergen Reactions  . Codeine Nausea And Vomiting  . Percocet [Oxycodone-Acetaminophen] Hives     PERTINENT MEDICATIONS:  Outpatient Encounter Medications as of 01/02/2018  Medication Sig  . aspirin 325 MG tablet Take 325 mg daily by mouth.  Marland Kitchen buPROPion (WELLBUTRIN XL) 300 MG 24 hr tablet Take 300 mg by mouth daily.   Marland Kitchen donepezil (ARICEPT) 10 MG tablet Take 1 tablet (10 mg total) at bedtime by mouth.  . Vitamin D, Ergocalciferol, (DRISDOL) 50000 units CAPS capsule Take 50,000 Units by mouth every 7 (seven) days.    No facility-administered encounter medications on file as of 01/02/2018.     PHYSICAL EXAM:   General: NAD, frail appearing, thin Cardiovascular: regular rate and rhythm Pulmonary: clear  ant fields Abdomen: soft, nontender, + bowel sounds GU: no suprapubic tenderness Extremities: no edema, no joint deformities Skin: no rashes Neurological: Weakness but otherwise nonfocal  Elaine Kasinger G [place], NP

## 2018-07-02 ENCOUNTER — Other Ambulatory Visit: Payer: Self-pay | Admitting: Neurology

## 2019-05-31 ENCOUNTER — Telehealth: Payer: Self-pay

## 2019-05-31 NOTE — Telephone Encounter (Signed)
Message left to check in and to offer to schedule a visit with Palliative care.

## 2019-08-21 ENCOUNTER — Telehealth: Payer: Self-pay

## 2019-08-21 NOTE — Telephone Encounter (Signed)
Volunteer support call for palliative care, message left 

## 2020-09-07 ENCOUNTER — Ambulatory Visit: Payer: Medicare Other | Admitting: Neurology

## 2020-09-07 ENCOUNTER — Telehealth: Payer: Self-pay | Admitting: Neurology

## 2020-09-07 NOTE — Telephone Encounter (Signed)
Pt's daughter, Piedad Climes called, cancelled appt due running late getting to mother's home.

## 2020-09-07 NOTE — Telephone Encounter (Signed)
Noted thanks °

## 2020-10-08 ENCOUNTER — Telehealth: Payer: Self-pay

## 2020-10-08 NOTE — Telephone Encounter (Signed)
Telephone Call/Scheduled Initial Visit SW completed call with patient/PCG to schedule an initial palliative care visit. SW left a message for patient/PCG requesting a call back to discuss palliative care services and schedule a visit.

## 2020-11-16 ENCOUNTER — Ambulatory Visit: Payer: Medicare Other | Admitting: Neurology

## 2020-11-23 ENCOUNTER — Encounter: Payer: Self-pay | Admitting: Neurology

## 2020-11-23 ENCOUNTER — Ambulatory Visit: Payer: Medicare Other | Admitting: Neurology

## 2020-11-23 ENCOUNTER — Telehealth: Payer: Self-pay | Admitting: Neurology

## 2020-11-23 NOTE — Telephone Encounter (Signed)
This patient did not show for a revisit appointment today.  This is the second revisit no-show.

## 2020-12-25 ENCOUNTER — Telehealth: Payer: Self-pay

## 2020-12-25 NOTE — Telephone Encounter (Signed)
(  3:00 pm) SW attempted to contact patient, patient's husband and daughter-Catherine. Patient and husband phone was not set up to leave a message. SW left a message for patient's daughter, requesting a call back to set up the initial palliative care visit.

## 2021-01-07 ENCOUNTER — Telehealth: Payer: Self-pay

## 2021-01-07 NOTE — Telephone Encounter (Signed)
(  2:45 pm) SW attempted to call patient to schedule an initial visit. Telephone call went straight to voicemail and SW was unable to leave a message. Patient's number listed in Epic is not a working number.

## 2021-01-26 ENCOUNTER — Telehealth: Payer: Self-pay

## 2021-01-26 NOTE — Telephone Encounter (Signed)
(  3:07 pm) SW returned daughter-Katherine's call. SW left a message requesting a call back.

## 2021-01-27 ENCOUNTER — Telehealth: Payer: Self-pay

## 2021-01-27 NOTE — Telephone Encounter (Signed)
(  3:54 pm) Initial palliative care visit scheduled for 02/03/21 @12 :15pm with RN/SW team.

## 2021-02-03 ENCOUNTER — Other Ambulatory Visit: Payer: Medicare Other | Admitting: *Deleted

## 2021-02-03 ENCOUNTER — Other Ambulatory Visit: Payer: Self-pay

## 2021-02-03 ENCOUNTER — Other Ambulatory Visit: Payer: Medicare Other

## 2021-02-03 DIAGNOSIS — Z515 Encounter for palliative care: Secondary | ICD-10-CM

## 2021-02-05 NOTE — Progress Notes (Signed)
COMMUNITY PALLIATIVE CARE SW NOTE  PATIENT NAME: Elaine Mcdonald DOB: 1930-12-14 MRN: [id number]  PRIMARY CARE PROVIDER: Aletha Halim., PA-C  RESPONSIBLE PARTY:  Acct ID - Guarantor Home Phone Work Phone Relationship Acct Type  [id number] Calise Dunckel,* [phone]  Self P/F     [address], [place],  [address]     PLAN OF CARE and INTERVENTIONS:             GOALS OF CARE/ ADVANCE CARE PLANNING:  Goal is for patient to remain in the home. Patient has advance directives. Patient has advance directives.  SOCIAL/EMOTIONAL/SPIRITUAL ASSESSMENT/ INTERVENTIONS:  SW and RN-M. Nadara Mustard completed an initial visit with patient  at her home. She was present with her daughter-Catherine and sitter-[place]. SW and RN initially met with patient alone and provided education regarding palliative care. Patient verbalized understanding and provided verbal consent to services. Patient's verbalizations were repetitive and she often deflected to her husband needing care and not herself. Patient has a Stage 3 Chronic Kidney Disease. Her daughter and sitter joined [place] at that time and provided a status update on patient. Patient is totally incontinent of bowl and bladder. She requires assistance with bathing. Patient prefer to use her cane, however her daughter and sitter encourage patient to use her walker for balance and safety. Patient is having increased pain in her knees, which she becomes stiff if she sits too long. Patient's appetite remains good and she will began receiving 2 hot meals 5x/week via mobile meals. Patient's daughter manages patient's pill box. Patient has some swelling in her leg, but keeps them elevated. Patient usually sleep in her that lift chair. Patient is at risk for falls and her last fall was 2 months ago. Her daughter reported that she observed a tinge of blood in patient's brief and patient is having increased incidents of incontinence and where blood is observed in her brief.  Patient has complains of back pain and was placed on a low dose of antibiotics. Patient was born and raised in [place], [place]. She is married and have four children. Patient has bachelor's degree. She is Magazine features editor by faith. Patient has a POA/HCPOA. She is a Full Code. Patient and family remain open to ongoing visits and support from the palliative care team.  PATIENT/CAREGIVER EDUCATION/ COPING:  Patient was alert and oriented x3, with some memory deficits were present. Patient was appears to be coping well. She has great family support through her children. PERSONAL EMERGENCY PLAN:  911 can be activated for emergencies. COMMUNITY RESOURCES COORDINATION/ HEALTH CARE NAVIGATION:  Patient has a Charity fundraiser 5 days a week. Patient's husband is a patient of [employer]. FINANCIAL/LEGAL CONCERNS/INTERVENTIONS:  None.     SOCIAL HX:  Social History   Tobacco Use   Smoking status: Former    Types: Cigarettes    Quit date: 10/26/1979    Years since quitting: 41.3   Smokeless tobacco: Never  Substance Use Topics   Alcohol use: Yes    CODE STATUS: Full Code ADVANCED DIRECTIVES: Yes MOST FORM COMPLETE:  No HOSPICE EDUCATION PROVIDED: No  PPS: Patient is alert and oriented x3 with some memory deficits. Patient ambulates with a cane or walker.  Duration of visit and documentation: 60 minutes.   [address] [place], [place]

## 2021-04-03 NOTE — Progress Notes (Signed)
[facility] RN NOTE  PATIENT NAME: Elaine Mcdonald DOB: 1930-07-07 MRN: [id number]  PRIMARY CARE PROVIDER: Aletha Halim., PA-C  RESPONSIBLE PARTY:  Acct ID - Guarantor Home Phone Work Phone Relationship Acct Type  [id number] Damien, Cisar [phone]  Self P/F     [address], [place], [place] [address]   Covid-19 Pre-screening Negative  PLAN OF CARE and INTERVENTION:  ADVANCE CARE PLANNING/GOALS OF CARE: Goal is for patient to remain in the home with her husband. She is a Full code. PATIENT/CAREGIVER EDUCATION: Explained palliative care services, symptom management, safe mobility, s/s of infection DISEASE STATUS: Joint initial palliative care visit completed with LCSW, M. Lonon. Met with patient, daughter Barnetta Chapel and hired caregiver [place] in the home. Upon arrival, patient answered the door. She did not remember the visit being scheduled today and thought we were there to see her husband. Initially, patient appears to be alert and oriented, but with further discussion her confusion and forgetfulness became more evident. Her husband is [facility] Patient often tried to defer the attention to her husband's condition vs her own. Her daughter and caregiver were able to provide her health history. Patient does have issues with knee pain and becomes stiff if she sits for an extended period of time. Her breathing is regular and unlabored. She is ambulatory with the use of her cane which she prefers, however her daughter tries to encourage her to utilize her walker due to her instability. She requires assistance with both bathing and dressing. She is incontinent of both bowel and bladder. At times blood is found in patient's brief. She denies in pelvic pain, cramping or discomfort, however did c/o lower back pain. She was placed on antibiotics. Her appetite is good. She takes her medications whole with water without  difficulty. No dysphagia. Barnetta Chapel manages her pill box. No recent falls, but she did have a fall about 2 months ago without injury. She is agreeable to future visits from palliative care.   HISTORY OF PRESENT ILLNESS: This is a 87 yo female with a diagnosis of moderate dementia with mood disturbance. She has a history of chronic kidney disease Stage 3a, hypothyroidism, elevated blood sugar and Vitamin D deficiency. Palliative care team was asked to follow patient for additional support, goals of care and complex decision making.   CODE STATUS: Full code ADVANCED DIRECTIVES: Y MOST FORM: no PPS: 50%   PHYSICAL EXAM:   LUNGS: clear to auscultation  CARDIAC: Cor RRR EXTREMITIES: Bilateral lower extremity edema SKIN:  Exposed skin is dry and intact   NEURO:  Alert and oriented x 2, intermittent confusion, forgetfulness, generalized weakness, ambulatory w/cane or walker    (Duration of visit and documentation 75 minutes)   Daryl Eastern, RN BSN

## 2021-06-15 ENCOUNTER — Telehealth: Payer: Self-pay

## 2021-06-15 NOTE — Telephone Encounter (Signed)
Message left for patient to check in and offer to schedule visit. ?

## 2021-08-20 ENCOUNTER — Ambulatory Visit: Payer: Medicare Other | Admitting: Family Medicine

## 2021-08-20 DIAGNOSIS — M1712 Unilateral primary osteoarthritis, left knee: Secondary | ICD-10-CM | POA: Diagnosis not present

## 2021-08-20 MED ORDER — METHYLPREDNISOLONE ACETATE 40 MG/ML IJ SUSP
40.0000 mg | Freq: Once | INTRAMUSCULAR | Status: AC
  Administered 2021-08-20: 40 mg via INTRA_ARTICULAR

## 2021-08-20 NOTE — Assessment & Plan Note (Signed)
CSI today and f/u PRN

## 2021-08-20 NOTE — Progress Notes (Signed)
  Elaine Mcdonald - 88 y.o. female MRN [id number]  Date of birth: 07/15/1930    SUBJECTIVE:      Chief Complaint:/ HPI:  She is here with her daughter for consideration of left knee corticosteroid injection.  She is having so much pain with the left knee when she tries to get out of a chair or get out of bed that she can really not do it on her own.  Her family helps her but note that the pain is gotten significantly worse in the last month.  Has previously had an injection in the right knee years ago and that seemed to help quite a bit.  She does not comment on any locking or giving way.    OBJECTIVE: BP (!) 153/50   Ht 5' 4.75" (1.645 m)   Wt 208 lb (94.3 kg)   BMI 34.88 kg/m   Physical Exam:  Vital signs are reviewed. GENERAL: Well-developed female no acute distress BMI 34.  Appears younger than her stated age. Knees: Symmetrical.  There is some mild synovial hypertrophy bilaterally noted.  She has medial greater than lateral joint line tenderness on the left knee.  She has full range of motion extension although there is some pain with the extreme ranges of full extension.  Popliteal space is benign.  She is ligamentously intact to varus and valgus stress. VASCULAR: Dorsalis pedis pulses are 2+ bilaterally symmetrical NEURO: Intact sensation to soft touch bilateral lower extremity.  PROCEDURE: INJECTION: Patient was given informed consent, signed copy in the chart. Appropriate time out was taken. Area prepped and draped in usual sterile fashion. Ethyl chloride was  used for local anesthesia. A 21 gauge 1 1/2 inch needle was used..  1 cc of methylprednisolone 40 mg/ml plus 4 cc of 1% lidocaine without epinephrine was injected into the left knee using a(n) anterior medial approach.   The patient tolerated the procedure well. There were no complications. Post procedure instructions were given.   ASSESSMENT & PLAN:  See problem based charting & AVS for pt instructions. DJD  (degenerative joint disease) of knee CSI today and f/u PRN

## 2021-08-20 NOTE — Patient Instructions (Signed)
It was great to see you today!  -We injected your left knee. For any soreness tonight and tomorrow you can ice for 15 minutes at a time, 3-4 times throughout the day. -We hope this gives you relief for at least the next several months. -Please call us if you have any questions. -Otherwise follow up as needed.

## 2021-09-15 ENCOUNTER — Telehealth: Payer: Self-pay

## 2021-09-15 NOTE — Telephone Encounter (Signed)
(  2:38 pm) SW left a message for patient's daughter requesting a call back.

## 2021-10-18 ENCOUNTER — Other Ambulatory Visit: Payer: Medicare Other

## 2021-10-18 DIAGNOSIS — Z515 Encounter for palliative care: Secondary | ICD-10-CM

## 2021-10-19 ENCOUNTER — Telehealth: Payer: Self-pay | Admitting: *Deleted

## 2021-10-19 NOTE — Telephone Encounter (Signed)
Patient's daughter Santina Evans returned call to palliative care. She states that patient now lives with her since her father has passed away, and she would like a visit scheduled. Visit scheduled with Joycelyn Man FNP for 10/20/21 at 12:30p.

## 2021-10-20 ENCOUNTER — Telehealth: Payer: Self-pay | Admitting: Family Medicine

## 2021-10-20 ENCOUNTER — Encounter: Payer: Self-pay | Admitting: Family Medicine

## 2021-10-20 ENCOUNTER — Other Ambulatory Visit: Payer: Medicare Other | Admitting: Family Medicine

## 2021-10-20 VITALS — BP 190/80 | HR 51 | Resp 18

## 2021-10-20 DIAGNOSIS — Z515 Encounter for palliative care: Secondary | ICD-10-CM | POA: Insufficient documentation

## 2021-10-20 DIAGNOSIS — R030 Elevated blood-pressure reading, without diagnosis of hypertension: Secondary | ICD-10-CM | POA: Insufficient documentation

## 2021-10-20 DIAGNOSIS — I10 Essential (primary) hypertension: Secondary | ICD-10-CM | POA: Insufficient documentation

## 2021-10-20 DIAGNOSIS — L89899 Pressure ulcer of other site, unspecified stage: Secondary | ICD-10-CM

## 2021-10-20 DIAGNOSIS — L8990 Pressure ulcer of unspecified site, unspecified stage: Secondary | ICD-10-CM | POA: Insufficient documentation

## 2021-10-20 NOTE — Progress Notes (Signed)
[facility] Consult Note Telephone: [phone]  Fax: [phone]    Date of encounter: 10/20/21 12:48 PM PATIENT NAME: Elaine Mcdonald [address] [place] [address]   [phone] (home)  DOB: 12/17/1930 MRN: [id number] PRIMARY CARE PROVIDER:    Amie Critchley,  [address] [place] [address] [phone]  REFERRING PROVIDER:   Aletha Halim., PA-C [address]  [place] [address] [phone]  RESPONSIBLE PARTY:    Contact Information     Name Relation Home Work Mobile   Kemppinen,Catherine Daughter   [phone]   Boldin,Margaret Daughter   [phone]        I met face to face with patient and daughters in the home. Palliative Care was asked to follow this patient by consultation request of  Aletha Halim., PA-C to address advance care planning and complex medical decision making. This is a follow up visit.                                   ASSESSMENT, SYMPTOM MANAGEMENT AND PLAN / RECOMMENDATIONS  Elevated BP Reading without Diagnosis of HTN Check BP 2 times per day and record.  Notify PCP if remains elevated today, goal < 140/90.  Educated on s/sx of CVA. Left vm on PCP practice answering machine with information about elevated BP and above recommendations.   Palliative Care Encounter Discussed goals of care and MOST form. Pt and family wish to discuss MOST form and will likely complete at a later date.   Pressure injury of skin, stage unspecified Pressure reduction cushion. Ambulate every 2-3 hours while awake. Continue barrier protective cream to sacrum BID and prn toileting.      Advance Care Planning/Goals of Care: Goals include to maximize quality of life and symptom management. Patient and Health care surrogate gave their permission to discuss   Our advance care planning conversation included a discussion about:    The value and importance of advance  care planning  Experiences with loved ones who have been seriously ill or have died-pt's spouse was in hospice care and they had a very positive experience Exploration of personal, cultural or spiritual beliefs that might influence medical decisions-strong belief in God Exploration of goals of care in the event of a sudden injury or illness  Identification of a healthcare agent  Review of advance directive documents -Reviewed DNR and MOST Family to discuss with patient and follow up if want creation of advance directive documents CODE STATUS: Full code at present    Follow up Palliative Care Visit: Palliative care will continue to follow for complex medical decision making, advance care planning, and clarification of goals. Return 2 weeks or prn.   This visit was coded based on medical decision making (MDM).  PPS: 50%  HOSPICE ELIGIBILITY/DIAGNOSIS: TBD  Chief Complaint:   HISTORY OF PRESENT ILLNESS:  Elaine Mcdonald is a 88 y.o. year old female with dementia, DJD and pressure injury with recurrent erythema of sacral and coccygeal skin without breakdown.  Pt denies falls, appetite is good. Not having pain currently but has had to have cortisone injection in knee. Bathing and dressing with assistance.  Incontinent of bowel and bladder.  Tailbone recently red and using Desitin.  She has wellness visit with Emmie Niemann, PA-C on July 25th. Mood is pretty good. No nausea, vomiting, constipation.  Notes some coughing  and sneezes after eating for years.   History obtained from review of EMR, discussion with family and/or Ms. Chahal.   Last labs at PCP [facility] 02/11/21: CMP, CBC with diff unremarkable HGB A1c 5.8% TSH normal at 4.33 Vitamin B12 low normal at 221 Vitamin D normal at 66   I reviewed EMR for available labs, medications, imaging, studies and related documents.  Records reviewed and summarized above.   ROS General: NAD EYES: denies vision changes ENMT: denies  dysphagia Cardiovascular: denies chest pain, endorses mild DOE Pulmonary: denies cough, denies increased SOB Abdomen: endorses good appetite, denies constipation, endorses incontinence of bowel GU: denies dysuria, endorses incontinence of urine MSK:  denies increased weakness,  no falls reported Skin: area of recurring redness over sacrum and coccyx with pt sitting a large portion of the day Neurological: denies pain, denies insomnia Psych: Endorses positive mood Heme/lymph/immuno: denies bruises, abnormal bleeding  Physical Exam: Current and past weights: 208 lbs as of 08/20/21 Constitutional: NAD General: WD, obese  CV: S1S2, RRR, no LE edema Pulmonary: CTAB, no increased work of breathing, no cough, room air Abdomen: normo-active BS + 4 quadrants, soft and non tender MSK: no sarcopenia, moves all extremities, ambulatory  Neuro:  no generalized weakness,  noted cognitive impairment Psych: non-anxious affect, A and O x 2 Hem/lymph/immuno: no widespread bruising   Thank you for the opportunity to participate in the care of [name].  The palliative care team will continue to follow. Please call our office at [phone] if we can be of additional assistance.    A , FNP -C  COVID-19 PATIENT SCREENING TOOL Asked and negative response unless otherwise noted:   Have you had symptoms of covid, tested positive or been in contact with someone with symptoms/positive test in the past 5-10 days? No  

## 2021-10-20 NOTE — Telephone Encounter (Signed)
Left vm on office answering machine that pt had elevated BP 190/80 and was told to recheck in 1-2 hours and call PCP if remains elevated. Pt/family were also instructed to keep a log of Bps BID until wellness visit with PCP 10/26/21, to notify 911 if has s/sx of CVA.  Pt also noted to be bradycardic but asymptomatic. Left phone number for Kathee Delton PA-C to call back. Joycelyn Man FNP-C

## 2021-10-22 ENCOUNTER — Encounter: Payer: Self-pay | Admitting: Family Medicine

## 2021-11-03 ENCOUNTER — Other Ambulatory Visit: Payer: 59 | Admitting: Family Medicine

## 2021-11-04 NOTE — Progress Notes (Signed)
COMMUNITY PALLIATIVE CARE SW NOTE  PATIENT NAME: Elaine Mcdonald DOB: 02/14/1931 MRN: [id number]  PRIMARY CARE PROVIDER: Richmond Campbell., PA-C  RESPONSIBLE PARTY:  Acct ID - Guarantor Home Phone Work Phone Relationship Acct Type  [id number] - Horen,* [phone]  Self P/F     [address], [place], [place] [address]   SOCIAL WORK TELEPHONIC ENCOUNTER (11:40 am-11:55 am)  PC SW completed a telephonic visit with patient's daughter-Catherine to assess patient needs, comfort and provide support. Santina Evans advised that patient is now incontinent of bowel and bladder. She using her walker with one person assist. She is having increased episodes of SOB. She is also having increased fatigue and weakness. Patient is no longer able to live alone and moved in with her daughter in February. Santina Evans is requesting a visit with the provider. SW advised her that she will contact the provider to update and will request follow-up to schedule a visit. *Palliative provider and RN updated for follow-up.   [address] [place], [place]

## 2021-12-16 ENCOUNTER — Encounter: Payer: Self-pay | Admitting: Family Medicine

## 2021-12-16 ENCOUNTER — Other Ambulatory Visit: Payer: Medicare Other | Admitting: Family Medicine

## 2021-12-16 VITALS — BP 148/60 | HR 76 | Resp 20

## 2021-12-16 DIAGNOSIS — R062 Wheezing: Secondary | ICD-10-CM

## 2021-12-16 DIAGNOSIS — T17908A Unspecified foreign body in respiratory tract, part unspecified causing other injury, initial encounter: Secondary | ICD-10-CM

## 2021-12-16 DIAGNOSIS — I10 Essential (primary) hypertension: Secondary | ICD-10-CM

## 2021-12-16 NOTE — Progress Notes (Unsigned)
[facility] Consult Note Telephone: [phone]  Fax: [phone]    Date of encounter: 12/16/21 11:37 AM PATIENT NAME: Elaine Mcdonald [address] [place] [address]   [phone] (home)  DOB: 05-Apr-1930 MRN: [id number] PRIMARY CARE PROVIDER:    Amie Critchley,  [address] [place] [address] [phone]  REFERRING PROVIDER:   Aletha Halim., PA-C [address],  [place] [address] [phone]  RESPONSIBLE PARTY:    Contact Information     Name Relation Home Work Mobile   Kemppinen,Catherine Daughter   [phone]   Kraker,Margaret Daughter   [phone]        I met face to face with patient and daughter in the home. Palliative Care was asked to follow this patient by consultation request of  Aletha Halim., PA-C to address advance care planning and complex medical decision making. This is a follow up visit.                                   ASSESSMENT, SYMPTOM MANAGEMENT AND PLAN / RECOMMENDATIONS  Hypertension Check BP 2 times per day and record.  Continues on Amlodipine 2.5 mg.  If BP remains above 140, discuss with PCP either increasing dose to 5 mg or splitting bid dosing so that she gets 2.5 mg BID.  2.  Wheezing Continue Guaifenesin 1200 mg BID Encourage increased fluid intake Notify PCP for worsening cough, discolored sputum production or fever. Has Albuterol nebs and encouraged BID for 2-3 days to see if this improves wheezing.  If no improvement after 5-7 days, follow up with PCP, may discuss use of possible Singulair if persistent in allergy season.  3.  Aspiration into airway, initial encounter Observed aspiration and subsequent coughing during visit. Monitor for fever, productive cough, shortness of breath     Advance Care Planning/Goals of Care: Goals include to maximize quality of life and symptom management.  CODE STATUS: Full code at  present    Follow up Palliative Care Visit: Palliative care will continue to follow for complex medical decision making, advance care planning, and clarification of goals. Return 4 weeks or prn.   This visit was coded based on medical decision making (MDM).  PPS: 50%  HOSPICE ELIGIBILITY/DIAGNOSIS: TBD  Chief Complaint:  Palliative Care is following for chronic medical management in setting of dementia.  HISTORY OF PRESENT ILLNESS:  Elaine Mcdonald is a 88 y.o. year old female with dementia, DJD and pressure injury with recurrent erythema of sacral and coccygeal skin without breakdown. Yesterday daughter noted that she was coughing more, no fever and some wheezing.  Gave Mucinex.   Pt denies falls, appetite is good. Not having pain currently but has not had cortisone injection in knee. Bathing and dressing with assistance.  Incontinent of bowel and bladder.  Tailbone is not red with use of A & D ointment.  Mood is pretty good. No nausea, vomiting, constipation. Pt was eating cereal and drinking coffee during visit, was observed to aspirate and start coughing extensively while eating.   History obtained from review of EMR, discussion with family and/or Elaine Mcdonald.    I reviewed EMR for available labs, medications, imaging, studies and related documents.  No new data found.   ROS General: NAD EYES: denies vision changes ENMT: denies dysphagia Cardiovascular: denies chest pain, endorses mild DOE Pulmonary: endorses intermittent NP  cough, denies increased SOB Abdomen: endorses appetite, denies constipation, endorses incontinence of bowel GU: denies dysuria, endorses incontinence of urine MSK:  denies increased weakness,  no falls reported Skin: denies skin breakdown, area over coccyx and tailbone maintained with A & D ointment Neurological: denies pain, denies insomnia Psych: Endorses positive mood Heme/lymph/immuno: denies bruises, abnormal bleeding  Physical Exam: Current  and past weights: 208 lbs as of 08/20/21, no recent weight Constitutional: NAD General: WD, obese  CV: S1S2, RRR, no LE edema Pulmonary: Scattered inspiratory and expiratory wheezing throughout, non-productive cough exacerbated after eating cereal, room air Abdomen: normo-active BS + 4 quadrants, soft and non tender MSK: no sarcopenia, moves all extremities, ambulatory  Neuro:  no generalized weakness,  noted cognitive impairment Psych: non-anxious affect, A and O x 2 Hem/lymph/immuno: no widespread bruising   Thank you for the opportunity to participate in the care of Elaine Mcdonald.  The palliative care team will continue to follow. Please call our office at [phone] if we can be of additional assistance.   Marijo Conception, FNP -C  COVID-19 PATIENT SCREENING TOOL Asked and negative response unless otherwise noted:   Have you had symptoms of covid, tested positive or been in contact with someone with symptoms/positive test in the past 5-10 days? No

## 2021-12-17 ENCOUNTER — Encounter: Payer: Self-pay | Admitting: Family Medicine

## 2021-12-17 DIAGNOSIS — T17908A Unspecified foreign body in respiratory tract, part unspecified causing other injury, initial encounter: Secondary | ICD-10-CM | POA: Insufficient documentation

## 2022-01-13 ENCOUNTER — Other Ambulatory Visit: Payer: Medicare Other | Admitting: Family Medicine

## 2022-01-13 VITALS — BP 210/88 | HR 68 | Resp 18

## 2022-01-13 DIAGNOSIS — R1310 Dysphagia, unspecified: Secondary | ICD-10-CM

## 2022-01-13 DIAGNOSIS — I10 Essential (primary) hypertension: Secondary | ICD-10-CM

## 2022-01-13 DIAGNOSIS — J310 Chronic rhinitis: Secondary | ICD-10-CM

## 2022-01-13 NOTE — Progress Notes (Addendum)
[facility] Consult Note Telephone: [phone]  Fax: [phone]    Date of encounter: 01/13/22 12:19 PM PATIENT NAME: Elaine Mcdonald [address] [place] [address]   [phone] (home)  DOB: 11/09/1930 MRN: [id number] PRIMARY CARE PROVIDER:    Amie Critchley,  [address] [place] [address] [phone]  REFERRING PROVIDER:   Aletha Halim., PA-C [address],  [place] [address] [phone]  RESPONSIBLE PARTY:    Contact Information     Name Relation Home Work Mobile   Kemppinen,Catherine Daughter   [phone]   Soyars,Margaret Daughter   [phone]        I met face to face with patient and daughter in the home. Palliative Care was asked to follow this patient by consultation request of  Aletha Halim., PA-C to address advance care planning and complex medical decision making. This is a follow up visit.                                   ASSESSMENT, SYMPTOM MANAGEMENT AND PLAN / RECOMMENDATIONS  Hypertension Elevated today-Give Amlodipine 5 mg now.  Recheck BP in 1 hour and call PCP with results to see how she wants you to manage going forward.  NP notified PCP of elevation and instructions given. Check BP 2 times per day and keep record for PCP.  May benefit from increase to Amlodipine 10 mg vs addition of ACE-I/ARB (if renal function permits) Recommended Home health RN to provide frequent BP monitoring and education on medications  2.    Chronic rhinitis/wheezing Agree with saline nasal lavage  If no improvement and continued wheezing, recommend considering Montelukast     3.    Swallowing problem Recommended HH Speech therapy to eval for dysphagia and teach safe swallow strategies. Discusses alternating sips/bites, chin tuck, cutting in small pieces.     Advance Care Planning/Goals of Care: Goals include to maximize quality of life and symptom management.   CODE STATUS: Full code at present    Follow up Palliative Care Visit: Palliative care will continue to follow for complex medical decision making, advance care planning, and clarification of goals. Return 4 weeks or prn.   This visit was coded based on medical decision making (MDM).  PPS: 50%  HOSPICE ELIGIBILITY/DIAGNOSIS: TBD  Chief Complaint:  Palliative Care is following for chronic medical management in setting of dementia.  HISTORY OF PRESENT ILLNESS:  Elaine Mcdonald is a 88 y.o. year old female with dementia, DJD and pressure injury with recurrent erythema of sacral and coccygeal skin without breakdown. Denies CP, SOB, dysuria, nausea and vomiting, constipation. DOE per daughter.  Daughter states she has some chronic congestion worse at night and some wheezing. Daughter got nasal saline to rinse sinuses prn and continues giving Mucinex DM to decrease chest congestion. Pt denies falls, appetite is good. Not having as much pain in knee since injection, daughter got copper infused knee braces. Bathing and dressing with assistance.  Incontinent of bowel and bladder.   Mood is pretty good. She continues to have some mild coughing intermittently with intake. She has not taken her Amlodipine 2.5 mg yet today.  Advised to increase to 5 mg for now, wait to get OOB for 1 hour and recheck.  Advised to call PCP for advice on management. Denies headache and CP.   History obtained from  review of EMR, discussion with family and/or Ms. Chaudoin.    I reviewed EMR for available labs, medications, imaging, studies and related documents.  No new data found.   ROS General: NAD EYES: denies vision changes ENMT: denies dysphagia Cardiovascular: denies chest pain, endorses DOE Pulmonary: endorses intermittent NP cough, denies increased SOB Abdomen: endorses appetite, denies constipation, endorses incontinence of bowel GU: denies dysuria, endorses incontinence of urine MSK:  denies increased  weakness, no falls reported Skin: denies skin breakdown Neurological: denies pain, denies insomnia Psych: Endorses positive mood Heme/lymph/immuno: denies bruises, abnormal bleeding  Physical Exam: Current and past weights: 208 lbs as of 08/20/21, no recent weight Constitutional: NAD General: WD, obese  CV: S1S2, RRR, no LE edema Pulmonary: RLL expiratory wheezing, clear otherwise, room air Abdomen: normo-active BS + 4 quadrants, soft and non tender MSK: no sarcopenia, moves all extremities, ambulatory  Neuro:  no generalized weakness,  noted cognitive impairment Psych: non-anxious affect, A and O x 2 Hem/lymph/immuno: no widespread bruising   Thank you for the opportunity to participate in the care of Elaine Mcdonald.  The palliative care team will continue to follow. Please call our office at [phone] if we can be of additional assistance.   Marijo Conception, FNP -C  COVID-19 PATIENT SCREENING TOOL Asked and negative response unless otherwise noted:   Have you had symptoms of covid, tested positive or been in contact with someone with symptoms/positive test in the past 5-10 days? No

## 2022-01-22 ENCOUNTER — Encounter: Payer: Self-pay | Admitting: Family Medicine

## 2022-01-22 DIAGNOSIS — J310 Chronic rhinitis: Secondary | ICD-10-CM | POA: Insufficient documentation

## 2022-01-22 DIAGNOSIS — R1310 Dysphagia, unspecified: Secondary | ICD-10-CM | POA: Insufficient documentation
